# Patient Record
Sex: Male | Born: 1975
Health system: Southern US, Community
[De-identification: ages and names within clinical notes are randomized; demographics above are authoritative.]

## PROBLEM LIST (undated history)

## (undated) DIAGNOSIS — B019 Varicella without complication: Secondary | ICD-10-CM

## (undated) DIAGNOSIS — F32A Depression, unspecified: Secondary | ICD-10-CM

## (undated) DIAGNOSIS — F329 Major depressive disorder, single episode, unspecified: Secondary | ICD-10-CM

## (undated) DIAGNOSIS — G4730 Sleep apnea, unspecified: Secondary | ICD-10-CM

## (undated) DIAGNOSIS — E669 Obesity, unspecified: Secondary | ICD-10-CM

## (undated) DIAGNOSIS — M722 Plantar fascial fibromatosis: Secondary | ICD-10-CM

## (undated) DIAGNOSIS — F419 Anxiety disorder, unspecified: Secondary | ICD-10-CM

## (undated) HISTORY — DX: Varicella without complication: B01.9

## (undated) HISTORY — DX: Obesity, unspecified: E66.9

## (undated) HISTORY — DX: Sleep apnea, unspecified: G47.30

## (undated) HISTORY — DX: Depression, unspecified: F32.A

## (undated) HISTORY — DX: Major depressive disorder, single episode, unspecified: F32.9

## (undated) HISTORY — PX: TONSILLECTOMY: SUR1361

## (undated) HISTORY — DX: Plantar fascial fibromatosis: M72.2

## (undated) HISTORY — DX: Anxiety disorder, unspecified: F41.9

---

## 2001-01-15 ENCOUNTER — Encounter: Admission: RE | Admit: 2001-01-15 | Discharge: 2001-01-15 | Payer: Self-pay

## 2003-05-11 ENCOUNTER — Encounter: Admission: RE | Admit: 2003-05-11 | Discharge: 2003-05-11 | Payer: Self-pay | Admitting: Occupational Medicine

## 2004-08-02 ENCOUNTER — Ambulatory Visit: Payer: Self-pay | Admitting: Family Medicine

## 2004-08-09 ENCOUNTER — Ambulatory Visit: Payer: Self-pay | Admitting: Family Medicine

## 2004-09-18 ENCOUNTER — Encounter: Admission: RE | Admit: 2004-09-18 | Discharge: 2004-12-17 | Payer: Self-pay | Admitting: Family Medicine

## 2005-01-13 ENCOUNTER — Ambulatory Visit: Payer: Self-pay | Admitting: Family Medicine

## 2005-01-27 ENCOUNTER — Ambulatory Visit: Payer: Self-pay | Admitting: Family Medicine

## 2005-09-09 ENCOUNTER — Ambulatory Visit: Payer: Self-pay | Admitting: Family Medicine

## 2005-11-09 ENCOUNTER — Emergency Department (HOSPITAL_COMMUNITY): Admission: EM | Admit: 2005-11-09 | Discharge: 2005-11-09 | Payer: Self-pay | Admitting: Family Medicine

## 2005-11-10 ENCOUNTER — Ambulatory Visit: Payer: Self-pay | Admitting: Family Medicine

## 2005-11-17 ENCOUNTER — Ambulatory Visit: Payer: Self-pay | Admitting: Family Medicine

## 2006-02-22 ENCOUNTER — Emergency Department (HOSPITAL_COMMUNITY): Admission: EM | Admit: 2006-02-22 | Discharge: 2006-02-22 | Payer: Self-pay | Admitting: Family Medicine

## 2006-02-23 ENCOUNTER — Ambulatory Visit: Payer: Self-pay | Admitting: Family Medicine

## 2006-02-27 ENCOUNTER — Ambulatory Visit: Payer: Self-pay | Admitting: Family Medicine

## 2007-04-27 ENCOUNTER — Telehealth: Payer: Self-pay | Admitting: Family Medicine

## 2007-10-22 ENCOUNTER — Ambulatory Visit: Payer: Self-pay | Admitting: Family Medicine

## 2007-10-26 ENCOUNTER — Encounter: Payer: Self-pay | Admitting: Family Medicine

## 2007-10-26 LAB — CONVERTED CEMR LAB
ALT: 26 units/L (ref 0–53)
AST: 35 units/L (ref 0–37)
Albumin: 4.1 g/dL (ref 3.5–5.2)
Alkaline Phosphatase: 64 units/L (ref 39–117)
BUN: 11 mg/dL (ref 6–23)
Basophils Absolute: 0 10*3/uL (ref 0.0–0.1)
Basophils Relative: 0 % (ref 0.0–1.0)
Bilirubin, Direct: 0.1 mg/dL (ref 0.0–0.3)
CO2: 28 meq/L (ref 19–32)
Calcium: 9.5 mg/dL (ref 8.4–10.5)
Chloride: 104 meq/L (ref 96–112)
Creatinine, Ser: 0.9 mg/dL (ref 0.4–1.5)
Eosinophils Absolute: 0.2 10*3/uL (ref 0.0–0.7)
Eosinophils Relative: 2 % (ref 0.0–5.0)
GFR calc Af Amer: 125 mL/min
GFR calc non Af Amer: 103 mL/min
Glucose, Bld: 86 mg/dL (ref 70–99)
HCT: 45.5 % (ref 39.0–52.0)
Hemoglobin: 15.4 g/dL (ref 13.0–17.0)
Lymphocytes Relative: 38.7 % (ref 12.0–46.0)
MCHC: 33.8 g/dL (ref 30.0–36.0)
MCV: 95 fL (ref 78.0–100.0)
Monocytes Absolute: 0.7 10*3/uL (ref 0.1–1.0)
Monocytes Relative: 7.2 % (ref 3.0–12.0)
Neutro Abs: 4.7 10*3/uL (ref 1.4–7.7)
Neutrophils Relative %: 52.1 % (ref 43.0–77.0)
Platelets: 340 10*3/uL (ref 150–400)
Potassium: 4 meq/L (ref 3.5–5.1)
RBC: 4.79 M/uL (ref 4.22–5.81)
RDW: 11.3 % — ABNORMAL LOW (ref 11.5–14.6)
Sodium: 139 meq/L (ref 135–145)
TSH: 1.2 microintl units/mL (ref 0.35–5.50)
Total Bilirubin: 1.1 mg/dL (ref 0.3–1.2)
Total Protein: 7.4 g/dL (ref 6.0–8.3)
WBC: 9.1 10*3/uL (ref 4.5–10.5)

## 2007-10-29 ENCOUNTER — Emergency Department (HOSPITAL_COMMUNITY): Admission: EM | Admit: 2007-10-29 | Discharge: 2007-10-29 | Payer: Self-pay | Admitting: Emergency Medicine

## 2007-10-29 ENCOUNTER — Ambulatory Visit: Payer: Self-pay | Admitting: Family Medicine

## 2007-10-29 DIAGNOSIS — K219 Gastro-esophageal reflux disease without esophagitis: Secondary | ICD-10-CM

## 2007-11-05 ENCOUNTER — Ambulatory Visit: Payer: Self-pay | Admitting: Family Medicine

## 2007-11-30 ENCOUNTER — Telehealth: Payer: Self-pay | Admitting: Family Medicine

## 2007-12-20 ENCOUNTER — Ambulatory Visit: Payer: Self-pay | Admitting: Family Medicine

## 2007-12-20 DIAGNOSIS — E785 Hyperlipidemia, unspecified: Secondary | ICD-10-CM | POA: Insufficient documentation

## 2007-12-23 ENCOUNTER — Ambulatory Visit: Payer: Self-pay | Admitting: Family Medicine

## 2007-12-23 LAB — CONVERTED CEMR LAB
Bilirubin Urine: NEGATIVE
Blood in Urine, dipstick: NEGATIVE
Cholesterol: 187 mg/dL (ref 0–200)
Glucose, Urine, Semiquant: NEGATIVE
HDL: 26.6 mg/dL — ABNORMAL LOW (ref >39.0)
Ketones, urine, test strip: NEGATIVE
LDL Cholesterol: 130 mg/dL — ABNORMAL HIGH (ref 0–99)
Nitrite: NEGATIVE
Protein, U semiquant: NEGATIVE
Specific Gravity, Urine: 1.02
Total CHOL/HDL Ratio: 7
Triglycerides: 151 mg/dL — ABNORMAL HIGH (ref 0–149)
Urobilinogen, UA: 0.2
VLDL: 30 mg/dL (ref 0–40)
WBC Urine, dipstick: NEGATIVE
pH: 5.5

## 2008-01-05 ENCOUNTER — Telehealth: Payer: Self-pay | Admitting: Family Medicine

## 2008-02-25 ENCOUNTER — Ambulatory Visit: Payer: Self-pay | Admitting: Family Medicine

## 2008-06-21 ENCOUNTER — Telehealth: Payer: Self-pay | Admitting: Family Medicine

## 2008-07-14 ENCOUNTER — Telehealth: Payer: Self-pay | Admitting: Family Medicine

## 2008-08-22 ENCOUNTER — Encounter: Payer: Self-pay | Admitting: Family Medicine

## 2008-09-20 ENCOUNTER — Emergency Department (HOSPITAL_COMMUNITY): Admission: EM | Admit: 2008-09-20 | Discharge: 2008-09-20 | Payer: Self-pay | Admitting: Emergency Medicine

## 2009-01-25 ENCOUNTER — Telehealth: Payer: Self-pay | Admitting: Family Medicine

## 2009-02-23 ENCOUNTER — Emergency Department (HOSPITAL_COMMUNITY): Admission: EM | Admit: 2009-02-23 | Discharge: 2009-02-23 | Payer: Self-pay | Admitting: Family Medicine

## 2009-06-03 ENCOUNTER — Emergency Department (HOSPITAL_COMMUNITY): Admission: EM | Admit: 2009-06-03 | Discharge: 2009-06-03 | Payer: Self-pay | Admitting: Emergency Medicine

## 2009-08-09 ENCOUNTER — Telehealth: Payer: Self-pay | Admitting: Family Medicine

## 2009-08-10 ENCOUNTER — Ambulatory Visit: Payer: Self-pay | Admitting: Family Medicine

## 2010-02-05 ENCOUNTER — Telehealth: Payer: Self-pay | Admitting: Family Medicine

## 2010-02-11 ENCOUNTER — Telehealth: Payer: Self-pay | Admitting: Family Medicine

## 2010-08-06 NOTE — Progress Notes (Signed)
Summary: refill ambien  Phone Note Refill Request Message from:  Fax from Pharmacy on February 11, 2010 9:22 AM  Refills Requested: Medication #1:  AMBIEN 10 MG TABS 1 by mouth at bedtime   Dosage confirmed as above?Dosage Confirmed   Supply Requested: 1 month   Last Refilled: 01/04/2010  Method Requested: Fax to Local Pharmacy Initial call taken by: Raechel Ache, RN,  February 11, 2010 9:22 AM [facility]  Follow-up for Phone Call        call in #30 with 5 rf Follow-up by: Nelwyn Salisbury MD,  February 11, 2010 12:47 PM  Additional Follow-up for Phone Call Additional follow up Details #1::        Rx faxed to pharmacy Additional Follow-up by: Raechel Ache, RN,  February 11, 2010 12:51 PM    Prescriptions: AMBIEN 10 MG TABS (ZOLPIDEM TARTRATE) 1 by mouth at bedtime  #30 x 5   Entered by:   Raechel Ache, RN   Authorized by:   Nelwyn Salisbury MD   Signed by:   Raechel Ache, RN on 02/11/2010   Method used:   Historical   RxID:   [id number]

## 2010-08-06 NOTE — Progress Notes (Signed)
Summary: refill alprazolam  Phone Note Refill Request Message from:  Fax from Pharmacy on February 05, 2010 4:29 PM  Refills Requested: Medication #1:  ALPRAZOLAM 1 MG  TB24 two times a day as needed anxiety   [facility]: [facility]2010 [facility]    [phone]   Method Requested: Telephone to Pharmacy Initial call taken by: Duard Brady LPN,  February 05, 2010 4:29 PM  Follow-up for Phone Call        call in #60 with 5 rf Follow-up by: Nelwyn Salisbury MD,  February 06, 2010 9:44 AM  Additional Follow-up for Phone Call Additional follow up Details #1::        Rx called to pharmacy Additional Follow-up by: Raechel Ache, RN,  February 06, 2010 9:48 AM    Prescriptions: ALPRAZOLAM 1 MG  TB24 (ALPRAZOLAM) two times a day as needed anxiety  #60 x 5   Entered by:   Raechel Ache, RN   Authorized by:   Nelwyn Salisbury MD   Signed by:   Raechel Ache, RN on 02/06/2010   Method used:   Historical   RxID:   [id number]

## 2010-08-06 NOTE — Assessment & Plan Note (Signed)
Summary: MED CK (REFILLS) // RS   Vital Signs:  Patient profile:   35 year old male Weight:      324 pounds BMI:     44.72 Temp:     98.1 degrees F oral Pulse rate:   72 / minute BP sitting:   112 / 82  (left arm) Cuff size:   large  Vitals Entered By: Alfred Levins, CMA (August 10, 2009 11:59 AM) CC: renew meds   History of Present Illness: Here for med refills after a long absence. He had lost his insurance, and now he has it again. he has been pout of meds for a few months, and he has been dealing with a lot of anxiety again. He has trouble sleeping. He continues to put on weight, and he asks to meet with a Nutritionist again.   Current Medications (verified): 1)  Ambien 10 Mg Tabs (Zolpidem Tartrate) .Marland Kitchen.. 1 By Mouth At Bedtime 2)  Prozac 40 Mg  Caps (Fluoxetine Hcl) .Marland Kitchen.. 1 By Mouth Once Daily 3)  Alprazolam 1 Mg  Tb24 (Alprazolam) .... Two Times A Day As Needed Anxiety 4)  Nexium 40 Mg  Cpdr (Esomeprazole Magnesium) .... Once Daily 5)  Diclofenac Sodium 50 Mg Tbec (Diclofenac Sodium) .... Three Times A Day As Needed Pain  Allergies (verified): No Known Drug Allergies  Past History:  Past Medical History: Reviewed history from 12/20/2007 and no changes required. Anxiety insomnia GERD Hyperlipidemia  Review of Systems  The patient denies anorexia, fever, weight loss, vision loss, decreased hearing, hoarseness, chest pain, syncope, dyspnea on exertion, peripheral edema, prolonged cough, headaches, hemoptysis, abdominal pain, melena, hematochezia, hematuria, incontinence, genital sores, muscle weakness, suspicious skin lesions, transient blindness, difficulty walking, depression, unusual weight change, abnormal bleeding, enlarged lymph nodes, angioedema, breast masses, and testicular masses.    Physical Exam  General:  overweight-appearing.   Lungs:  Normal respiratory effort, chest expands symmetrically. Lungs are clear to auscultation, no crackles or wheezes. Heart:   Normal rate and regular rhythm. S1 and S2 normal without gallop, murmur, click, rub or other extra sounds.   Impression & Recommendations:  Problem # 1:  HYPERLIPIDEMIA (ICD-272.4)  Orders: Nutrition Referral (Nutrition)  Problem # 2:  GERD (ICD-530.81)  The following medications were removed from the medication list:    Nexium 40 Mg Cpdr (Esomeprazole magnesium) ..... Once daily His updated medication list for this problem includes:    Omeprazole 40 Mg Cpdr (Omeprazole) ..... Once daily  Problem # 3:  ANXIETY (ICD-300.00)  His updated medication list for this problem includes:    Prozac 40 Mg Caps (Fluoxetine hcl) .Marland Kitchen... 1 by mouth once daily    Alprazolam 1 Mg Tb24 (Alprazolam) .Marland Kitchen..Marland Kitchen Two times a day as needed anxiety  Problem # 4:  SHOULDER PAIN (ICD-719.41)  His updated medication list for this problem includes:    Diclofenac Sodium 50 Mg Tbec (Diclofenac sodium) .Marland Kitchen... Three times a day as needed pain  Complete Medication List: 1)  Ambien 10 Mg Tabs (Zolpidem tartrate) .Marland Kitchen.. 1 by mouth at bedtime 2)  Prozac 40 Mg Caps (Fluoxetine hcl) .Marland Kitchen.. 1 by mouth once daily 3)  Alprazolam 1 Mg Tb24 (Alprazolam) .... Two times a day as needed anxiety 4)  Diclofenac Sodium 50 Mg Tbec (Diclofenac sodium) .... Three times a day as needed pain 5)  Omeprazole 40 Mg Cpdr (Omeprazole) .... Once daily  Patient Instructions: 1)  It is important that you exercise reguarly at least 20 minutes 5 times a  week. If you develop chest pain, have severe difficulty breathing, or feel very tired, stop exercising immediately and seek medical attention.  2)  You need to lose weight. Consider a lower calorie diet and regular exercise.  3)  All meds were refilled. 4)  Will refer to Nutrition 5)  he will set up a cpx soon Prescriptions: DICLOFENAC SODIUM 50 MG TBEC (DICLOFENAC SODIUM) three times a day as needed pain  #90 x 5   Entered and Authorized by:   Nelwyn Salisbury MD   Signed by:   Nelwyn Salisbury MD on  08/10/2009   Method used:   Print then Give to Patient   RxID:   [id number] OMEPRAZOLE 40 MG CPDR (OMEPRAZOLE) once daily  #30 x 11   Entered and Authorized by:   Nelwyn Salisbury MD   Signed by:   Nelwyn Salisbury MD on 08/10/2009   Method used:   Print then Give to Patient   RxID:   [id number] ALPRAZOLAM 1 MG  TB24 (ALPRAZOLAM) two times a day as needed anxiety  #60 x 5   Entered and Authorized by:   Nelwyn Salisbury MD   Signed by:   Nelwyn Salisbury MD on 08/10/2009   Method used:   Print then Give to Patient   RxID:   [id number] PROZAC 40 MG  CAPS (FLUOXETINE HCL) 1 by mouth once daily  #30 x 11   Entered and Authorized by:   Nelwyn Salisbury MD   Signed by:   Nelwyn Salisbury MD on 08/10/2009   Method used:   Print then Give to Patient   RxID:   [id number] AMBIEN 10 MG TABS (ZOLPIDEM TARTRATE) 1 by mouth at bedtime  #30 x 5   Entered and Authorized by:   Nelwyn Salisbury MD   Signed by:   Nelwyn Salisbury MD on 08/10/2009   Method used:   Print then Give to Patient   RxID:   [id number]

## 2010-08-06 NOTE — Progress Notes (Signed)
Summary: refill xanax and ambien  Phone Note From Pharmacy   [facility]  [address] [phone]* Call For: Docie Abramovich  Summary of Call: refill xanax 1mg  1 by mouth two times a day as needed for anxiety and zolpidem 10mg  1 by mouth at bedtime Initial call taken by: Alfred Levins, CMA,  August 09, 2009 9:54 AM  Follow-up for Phone Call        We have not seen him in almost 2 years. he needs an OV to do this Follow-up by: Nelwyn Salisbury MD,  August 09, 2009 10:49 AM  Additional Follow-up for Phone Call Additional follow up Details #1::        Phone call completed, Pharmacist called Additional Follow-up by: Alfred Levins, CMA,  August 09, 2009 10:59 AM

## 2010-08-07 ENCOUNTER — Other Ambulatory Visit: Payer: Self-pay

## 2010-08-07 DIAGNOSIS — F419 Anxiety disorder, unspecified: Secondary | ICD-10-CM

## 2010-08-07 DIAGNOSIS — G4700 Insomnia, unspecified: Secondary | ICD-10-CM

## 2010-08-07 MED ORDER — ALPRAZOLAM 1 MG PO TABS: 1.0000 mg | ORAL_TABLET | Freq: Two times a day (BID) | ORAL | Status: DC | PRN

## 2010-08-07 MED ORDER — ZOLPIDEM TARTRATE 10 MG PO TABS: 10.0000 mg | ORAL_TABLET | Freq: Every evening | ORAL | Status: DC | PRN

## 2010-08-07 NOTE — Telephone Encounter (Signed)
[facility]ed [facility]  Fax [phone]

## 2010-08-08 ENCOUNTER — Other Ambulatory Visit: Payer: Self-pay

## 2010-08-08 DIAGNOSIS — G4700 Insomnia, unspecified: Secondary | ICD-10-CM

## 2010-08-08 DIAGNOSIS — F419 Anxiety disorder, unspecified: Secondary | ICD-10-CM

## 2010-08-09 MED ORDER — ALPRAZOLAM 1 MG PO TABS: 1.0000 mg | ORAL_TABLET | Freq: Two times a day (BID) | ORAL | Status: DC | PRN

## 2010-08-09 MED ORDER — ZOLPIDEM TARTRATE 10 MG PO TABS: 10.0000 mg | ORAL_TABLET | Freq: Every evening | ORAL | Status: AC | PRN

## 2010-10-03 ENCOUNTER — Other Ambulatory Visit: Payer: Self-pay | Admitting: Family Medicine

## 2010-10-05 ENCOUNTER — Other Ambulatory Visit: Payer: Self-pay | Admitting: Family Medicine

## 2010-10-09 LAB — RAPID STREP SCREEN (MED CTR MEBANE ONLY): Streptococcus, Group A Screen (Direct): NEGATIVE

## 2010-10-12 LAB — POCT URINALYSIS DIP (DEVICE)
Glucose, UA: NEGATIVE mg/dL
Nitrite: NEGATIVE
Protein, ur: 100 mg/dL — AB
Specific Gravity, Urine: >=1.03 (ref 1.005–1.030)
Urobilinogen, UA: 1 mg/dL (ref 0.0–1.0)
pH: 6 (ref 5.0–8.0)

## 2010-11-04 ENCOUNTER — Other Ambulatory Visit: Payer: Self-pay | Admitting: Family Medicine

## 2010-12-25 ENCOUNTER — Telehealth: Payer: Self-pay

## 2010-12-25 DIAGNOSIS — F419 Anxiety disorder, unspecified: Secondary | ICD-10-CM

## 2010-12-25 NOTE — Telephone Encounter (Signed)
Alprazolam 1 mg  Last rx written 08/08/10 with 5 refills

## 2010-12-26 NOTE — Telephone Encounter (Signed)
Call in #60 with 5 rf 

## 2010-12-27 MED ORDER — ALPRAZOLAM 1 MG PO TABS: 1.0000 mg | ORAL_TABLET | Freq: Two times a day (BID) | ORAL | Status: AC | PRN

## 2010-12-27 NOTE — Telephone Encounter (Signed)
Done

## 2011-07-16 ENCOUNTER — Telehealth: Payer: Self-pay | Admitting: Family Medicine

## 2011-07-16 NOTE — Telephone Encounter (Signed)
Refill request for Xanax 1 mg take 1 po bid and Ambien 10 mg take 1 po qhs and not sure when pt was last here.

## 2011-07-17 NOTE — Telephone Encounter (Signed)
NO refills until he sees me. It has been 2 years

## 2011-07-21 NOTE — Telephone Encounter (Signed)
I tried to call pt, no working home phone # and was told by person answering the cell # that I had the wrong number.

## 2012-09-29 ENCOUNTER — Other Ambulatory Visit: Payer: Self-pay | Admitting: Occupational Medicine

## 2012-09-29 ENCOUNTER — Ambulatory Visit: Payer: Self-pay

## 2012-09-29 DIAGNOSIS — Z021 Encounter for pre-employment examination: Secondary | ICD-10-CM

## 2012-12-21 ENCOUNTER — Other Ambulatory Visit (HOSPITAL_COMMUNITY)
Admission: RE | Admit: 2012-12-21 | Discharge: 2012-12-21 | Disposition: A | Discharge status: Home / Self Care | Payer: BC Managed Care – PPO | Source: Ambulatory Visit | Attending: Family Medicine | Admitting: Family Medicine

## 2012-12-21 ENCOUNTER — Ambulatory Visit (INDEPENDENT_AMBULATORY_CARE_PROVIDER_SITE_OTHER): Payer: BC Managed Care – PPO | Admitting: Family Medicine

## 2012-12-21 ENCOUNTER — Encounter: Payer: Self-pay | Admitting: Family Medicine

## 2012-12-21 VITALS — BP 98/70 | Temp 98.1°F | Ht 71.0 in | Wt 296.0 lb

## 2012-12-21 DIAGNOSIS — Z7689 Persons encountering health services in other specified circumstances: Secondary | ICD-10-CM

## 2012-12-21 DIAGNOSIS — Z23 Encounter for immunization: Secondary | ICD-10-CM

## 2012-12-21 DIAGNOSIS — Z113 Encounter for screening for infections with a predominantly sexual mode of transmission: Secondary | ICD-10-CM | POA: Insufficient documentation

## 2012-12-21 DIAGNOSIS — Z2089 Contact with and (suspected) exposure to other communicable diseases: Secondary | ICD-10-CM

## 2012-12-21 DIAGNOSIS — Z202 Contact with and (suspected) exposure to infections with a predominantly sexual mode of transmission: Secondary | ICD-10-CM

## 2012-12-21 DIAGNOSIS — K219 Gastro-esophageal reflux disease without esophagitis: Secondary | ICD-10-CM

## 2012-12-21 DIAGNOSIS — Z7189 Other specified counseling: Secondary | ICD-10-CM

## 2012-12-21 DIAGNOSIS — E669 Obesity, unspecified: Secondary | ICD-10-CM

## 2012-12-21 LAB — LIPID PANEL
Cholesterol: 168 mg/dL (ref 0–200)
HDL: 35.3 mg/dL — ABNORMAL LOW (ref >39.00)
Total CHOL/HDL Ratio: 5
Triglycerides: 202 mg/dL — ABNORMAL HIGH (ref 0.0–149.0)
VLDL: 40.4 mg/dL — ABNORMAL HIGH (ref 0.0–40.0)

## 2012-12-21 LAB — LDL CHOLESTEROL, DIRECT: Direct LDL: 108.6 mg/dL

## 2012-12-21 LAB — HEMOGLOBIN A1C: Hgb A1c MFr Bld: 5.5 % (ref 4.6–6.5)

## 2012-12-21 MED ORDER — OMEPRAZOLE 20 MG PO CPDR: 20.0000 mg | DELAYED_RELEASE_CAPSULE | Freq: Every day | ORAL | Status: DC

## 2012-12-21 MED ORDER — METRONIDAZOLE 500 MG PO TABS: 2000.0000 mg | ORAL_TABLET | Freq: Once | ORAL | Status: DC

## 2012-12-21 NOTE — Addendum Note (Signed)
Addended by: Azucena Freed on: 12/21/2012 11:07 AM   Modules accepted: Orders

## 2012-12-21 NOTE — Progress Notes (Signed)
Chief Complaint  Patient presents with  . Establish Care    HPI:  Paul Sweeney. is here to establish care. Used to see Dr. Clent Ridges - but has been several years since seen. Last PCP and physical: none in recent years.  Has the following chronic problems and concerns today:  Concern for Trichomonas: -separated from wife, but they occ still have relationships -she has trich and he wants to be tested for this -he is asymptomatic  GERD: -heartburn about a few times per week -worse at night -take prilosec intermittently for this    Patient Active Problem List   Diagnosis Date Noted  . HYPERLIPIDEMIA 12/20/2007  . GERD 10/29/2007   Health Maintenance:  ROS: See pertinent positives and negatives per HPI.  Past Medical History  Diagnosis Date  . Chicken pox   . Depression     never hospitalized, remote    Family History  Problem Relation Age of Onset  . Colon cancer    . Prostate cancer    . Hyperlipidemia Mother   . Hypertension Mother   . Diabetes Mother     History   Social History  . Marital Status: Legally Separated    Spouse Name: N/A    Number of Children: N/A  . Years of Education: N/A   Social History Main Topics  . Smoking status: Never Smoker   . Smokeless tobacco: None  . Alcohol Use: Yes     Comment: occ, 4 drinks about 1-2 time sper month  . Drug Use: No  . Sexually Active: None   Other Topics Concern  . 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, 8 and 6 in 2014)      Spiritual Beliefs:      Lifestyle: exercises daily - gym 5 days, CV, swimming, weights; diet is improving - son has glut-1 and on adkins type diet, pt has lost 30 lbs in last few months             Current outpatient prescriptions:Multiple Vitamins-Minerals (MULTIVITAL) tablet, Take 1 tablet by mouth daily., Disp: , Rfl: ;  metroNIDAZOLE (FLAGYL) 500 MG tablet, Take 4 tablets  (2,000 mg total) by mouth once., Disp: 4 tablet, Rfl: 0;  omeprazole (PRILOSEC) 20 MG capsule, Take 1 capsule (20 mg total) by mouth daily., Disp: 30 capsule, Rfl: 3  EXAM:  Filed Vitals:   12/21/12 0943  BP: 98/70  Temp: 98.1 F (36.7 C)    Body mass index is 41.3 kg/(m^2).  GENERAL: vitals reviewed and listed above, alert, oriented, appears well hydrated and in no acute distress  HEENT: atraumatic, conjunttiva clear, no obvious abnormalities on inspection of external nose and ears  NECK: no obvious masses on inspection  LUNGS: clear to auscultation bilaterally, no wheezes, rales or rhonchi, good air movement  CV: HRRR, no peripheral edema  MS: moves all extremities without noticeable abnormality  PSYCH: pleasant and cooperative, no obvious depression or anxiety  ASSESSMENT AND PLAN:  Discussed the following assessment and plan:  Exposure to venereal disease - Plan: Urine cytology ancillary only, HIV Antibody, RPR, Hep B Surface Antigen, Hepatitis B Core AB, Total, metroNIDAZOLE (FLAGYL) 500 MG tablet -return precautions/risks discussed  Encounter to establish care - Plan: Lipid Panel, Hemoglobin A1c - has not eaten in 7 hours - wants referral to nutrition for help with weight loss after labs  GERD (  gastroesophageal reflux disease) - Plan: omeprazole (PRILOSEC) 20 MG capsule -return precautions risks discussed  Obesity -lifestyle recs, labs, referral to nutrition after labs per his request  -We reviewed the PMH, PSH, FH, SH, Meds and Allergies. -We provided refills for any medications we will prescribe as needed. -We addressed current concerns per orders and patient instructions. -STI testing and tx for possible asymptomatic trich per his request today - risks discussed -refilled PPI -tdap today -We have advised patient to follow up per instructions below.   -Patient advised to return or notify a doctor immediately if symptoms worsen or persist or new concerns  arise.  Patient Instructions  -We have ordered labs or studies at this visit. It can take up to 1-2 weeks for results and processing. We will contact you with instructions IF your results are abnormal. Normal results will be released to your [facility]. If you have not heard from [place] or can not find your results in [facility] in 2 weeks please contact our office.  -PLEASE SIGN UP FOR MYCHART TODAY   We recommend the following healthy lifestyle measures: - eat a healthy diet consisting of lots of vegetables, fruits, beans, nuts, seeds, healthy meats such as white chicken and fish and whole grains.  - avoid fried foods, fast food, processed foods, sodas, red meet and other fattening foods.  - get a least 150 minutes of aerobic exercise per week.   Take medication as instructed  Follow up in: 1 year or as needed      Leticia Mcdiarmid R.

## 2012-12-21 NOTE — Patient Instructions (Signed)
-  We have ordered labs or studies at this visit. It can take up to 1-2 weeks for results and processing. We will contact you with instructions IF your results are abnormal. Normal results will be released to your [facility]. If you have not heard from [place] or can not find your results in [facility] in 2 weeks please contact our office.  -PLEASE SIGN UP FOR MYCHART TODAY   We recommend the following healthy lifestyle measures: - eat a healthy diet consisting of lots of vegetables, fruits, beans, nuts, seeds, healthy meats such as white chicken and fish and whole grains.  - avoid fried foods, fast food, processed foods, sodas, red meet and other fattening foods.  - get a least 150 minutes of aerobic exercise per week.   Take medication as instructed  Follow up in: 1 year or as needed

## 2012-12-22 ENCOUNTER — Encounter: Payer: Self-pay | Admitting: Family Medicine

## 2012-12-22 LAB — HEPATITIS B SURFACE ANTIGEN: Hepatitis B Surface Ag: NEGATIVE

## 2012-12-22 LAB — HIV ANTIBODY (ROUTINE TESTING W REFLEX): HIV: NONREACTIVE

## 2012-12-22 LAB — HEPATITIS B CORE ANTIBODY, TOTAL: Hep B Core Total Ab: NEGATIVE

## 2012-12-22 LAB — RPR

## 2012-12-23 ENCOUNTER — Telehealth: Payer: Self-pay | Admitting: Family Medicine

## 2012-12-23 NOTE — Telephone Encounter (Signed)
Pls advise.  

## 2012-12-23 NOTE — Telephone Encounter (Signed)
Alisha,  Please have him schedule appt - procedure appt. Let him know depending on location and appearance of these lesions we may be able to remove that day or may need to refer to dermatology.

## 2012-12-23 NOTE — Progress Notes (Signed)
Quick Note:  Left a message for pt at designated cell phone number. ______

## 2013-01-25 ENCOUNTER — Encounter: Payer: Self-pay | Admitting: Family Medicine

## 2013-01-25 ENCOUNTER — Ambulatory Visit (INDEPENDENT_AMBULATORY_CARE_PROVIDER_SITE_OTHER): Payer: BC Managed Care – PPO | Admitting: Family Medicine

## 2013-01-25 VITALS — BP 110/74 | Temp 97.6°F | Wt 286.0 lb

## 2013-01-25 DIAGNOSIS — L989 Disorder of the skin and subcutaneous tissue, unspecified: Secondary | ICD-10-CM

## 2013-01-25 DIAGNOSIS — L919 Hypertrophic disorder of the skin, unspecified: Secondary | ICD-10-CM

## 2013-01-25 DIAGNOSIS — E669 Obesity, unspecified: Secondary | ICD-10-CM

## 2013-01-25 DIAGNOSIS — E785 Hyperlipidemia, unspecified: Secondary | ICD-10-CM

## 2013-01-25 DIAGNOSIS — L918 Other hypertrophic disorders of the skin: Secondary | ICD-10-CM

## 2013-01-25 NOTE — Progress Notes (Signed)
Chief Complaint  Patient presents with  . skin tag removal    HPI:  Acute visit for:  1)Skin Tags: -neck and armpits - desires removal  2) skin lesion forehead: -worried about this and desires removal -has been there for some time -doesn't think has changed  3)Obesity/Dyslipidema: -working on diet and exercise -was not contacted about nutrition referral and still wishes to do this  ROS: See pertinent positives and negatives per HPI.  Past Medical History  Diagnosis Date  . Chicken pox   . Depression     never hospitalized, remote    Family History  Problem Relation Age of Onset  . Colon cancer    . Prostate cancer    . Hyperlipidemia Mother   . Hypertension Mother   . Diabetes Mother     History   Social History  . Marital Status: Legally Separated    Spouse Name: N/A    Number of Children: N/A  . Years of Education: N/A   Social History Main Topics  . Smoking status: Never Smoker   . Smokeless tobacco: None  . Alcohol Use: Yes     Comment: occ, 4 drinks about 1-2 time sper month  . Drug Use: No  . Sexually Active: None   Other Topics Concern  . 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  GENERAL: vitals reviewed and listed above, alert, oriented, appears well hydrated and in no acute  distress  HEENT: atraumatic, conjunttiva clear, no obvious abnormalities on inspection of external nose and ears  NECK: no obvious masses on inspection  SKIN: several larger skin tags neck and axillary region, flesh colored papule on L medial brow with telangiectsias  MS: moves all extremities without noticeable abnormality  PSYCH: pleasant and cooperative, no obvious depression or anxiety  ASSESSMENT AND PLAN:  Discussed the following assessment and plan:  Skin lesion - Plan: Ambulatory referral to Dermatology  Obesity, unspecified - Plan: Ambulatory referral to Nutrition and Diabetic Education  HYPERLIPIDEMIA - Plan: Ambulatory referral to Nutrition and Diabetic Education  Cutaneous skin tags - Plan: Ambulatory referral to Dermatology  -congratulated and supported on lifestyle changes and replaced referral as appears not done -discussed skin findings, potential etiology including neoplasm though less likely and offered removal her versus derm referral. He prefers not to have lesions removed today and desires referral to derm - referral placed.  -Patient advised to return or notify a doctor immediately if symptoms worsen or persist or new concerns arise.  Patient Instructions  -We placed a referral for you as discussed to the nutritionist and to the dermatologist. It usually takes about 1-2 weeks to process and schedule this  referral. If you have not heard from [place] regarding this appointment in 2 weeks please contact our office.  -keep up the great work with exercise and eating healthy       Amarissa Koerner R.

## 2013-01-25 NOTE — Patient Instructions (Signed)
-  We placed a referral for you as discussed to the nutritionist and to the dermatologist. It usually takes about 1-2 weeks to process and schedule this referral. If you have not heard from [place] regarding this appointment in 2 weeks please contact our office.  -keep up the great work with exercise and eating healthy

## 2013-02-07 ENCOUNTER — Telehealth: Payer: Self-pay | Admitting: Family Medicine

## 2013-02-07 NOTE — Telephone Encounter (Signed)
Caller Name: [place]  Phone: [phone]  Patient: Paul Sweeney, Paul Sweeney  Gender: Male  DOB: 1976/04/16  Age: 37 Years  PCP: Kriste Basque [facility])   Does the office need to follow up with this patient?: No  RN Note:  going through divorce, symptoms: nausea, not sleeping, "rush" not able to work, with a headache.   Reason For Call & Symptoms: emergent call, regarding panic attack, anxiety  Reviewed Health History In EMR: Yes  Reviewed Medications In EMR: Yes  Reviewed Allergies In EMR: Yes  Reviewed Surgeries / Procedures: Yes  Date of Onset of Symptoms: 02/05/2013  Guideline(s) Used:  Vomiting  Disposition Per Guideline:  See Today in Office  Reason For Disposition Reached:  Patient wants to be seen  Advice Given:  Call Back If:  You become worse.  Patient Will Follow Care Advice:  YES  Appointment Scheduled:  02/08/2013 10:45:00; Provider:  Kriste Basque [facility])

## 2013-02-08 ENCOUNTER — Ambulatory Visit (INDEPENDENT_AMBULATORY_CARE_PROVIDER_SITE_OTHER): Payer: BC Managed Care – PPO | Admitting: Family Medicine

## 2013-02-08 ENCOUNTER — Encounter: Payer: Self-pay | Admitting: Family Medicine

## 2013-02-08 VITALS — BP 102/70 | Temp 98.0°F | Wt 286.0 lb

## 2013-02-08 DIAGNOSIS — K529 Noninfective gastroenteritis and colitis, unspecified: Secondary | ICD-10-CM

## 2013-02-08 DIAGNOSIS — F341 Dysthymic disorder: Secondary | ICD-10-CM

## 2013-02-08 DIAGNOSIS — K5289 Other specified noninfective gastroenteritis and colitis: Secondary | ICD-10-CM

## 2013-02-08 DIAGNOSIS — F419 Anxiety disorder, unspecified: Secondary | ICD-10-CM

## 2013-02-08 MED ORDER — ONDANSETRON HCL 4 MG PO TABS: 4.0000 mg | ORAL_TABLET | Freq: Three times a day (TID) | ORAL | Status: DC | PRN

## 2013-02-08 MED ORDER — PAROXETINE HCL 20 MG PO TABS: 20.0000 mg | ORAL_TABLET | ORAL | Status: DC

## 2013-02-08 NOTE — Progress Notes (Signed)
Chief Complaint  Patient presents with  . Anxiety    HPI:  Acute visit for Anxiety and Depression: -hx of remote depression -going through divorce process -symptoms: worsening last few days nausea, a little diarrhea, difficulty sleeping, difficulty with work, headaches, feels overwhelmed after returning from vacation and dealing with x. Feels like gets irritable. Feels like has too many things gong on and poor appetite. Has a depressed mood as well. No thoughts of self harm or SI now or in the past. No Panic attacks. -reports on several medications in the past for depression and anxiety -does not see counselor -usually exercises but not recently -drinks a few drinks here and there -denies: fevers, chills, SI, sore throat, upper resp symptoms, manic symptoms  ROS: See pertinent positives and negatives per HPI.  Past Medical History  Diagnosis Date  . Chicken pox   . Depression     never hospitalized, remote    Family History  Problem Relation Age of Onset  . Colon cancer    . Prostate cancer    . Hyperlipidemia Mother   . Hypertension Mother   . Diabetes Mother     History   Social History  . Marital Status: Legally Separated    Spouse Name: N/A    Number of Children: N/A  . Years of Education: N/A   Social History Main Topics  . Smoking status: Never Smoker   . Smokeless tobacco: None  . Alcohol Use: Yes     Comment: occ, 4 drinks about 1-2 time sper month  . Drug Use: No  . Sexually Active: None   Other Topics Concern  . 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, 8 and 6 in 2014)      Spiritual Beliefs:      Lifestyle: exercises daily - gym 5 days, CV, swimming, weights; diet is improving - son has glut-1 and on adkins type diet, pt has lost 30 lbs in last few months             Current outpatient prescriptions:Multiple Vitamins-Minerals (MULTIVITAL) tablet, Take 1  tablet by mouth daily., Disp: , Rfl: ;  omeprazole (PRILOSEC) 20 MG capsule, Take 1 capsule (20 mg total) by mouth daily., Disp: 30 capsule, Rfl: 3;  ondansetron (ZOFRAN) 4 MG tablet, Take 1 tablet (4 mg total) by mouth every 8 (eight) hours as needed for nausea., Disp: 20 tablet, Rfl: 0 PARoxetine (PAXIL) 20 MG tablet, Take 1 tablet (20 mg total) by mouth every morning., Disp: 30 tablet, Rfl: 3  EXAM:  Filed Vitals:   02/08/13 1049  BP: 102/70  Temp: 98 F (36.7 C)    Body mass index is 39.91 kg/(m^2).  GENERAL: vitals reviewed and listed above, alert, oriented, appears well hydrated and in no acute distress  HEENT: atraumatic, conjunttiva clear, no obvious abnormalities on inspection of external nose and ears  NECK: no obvious masses on inspection  LUNGS: clear to auscultation bilaterally, no wheezes, rales or rhonchi, good air movement  CV: HRRR, no peripheral edema  ABD: BS+, Soft, NTTP  MS: moves all extremities without noticeable abnormality  PSYCH: pleasant and cooperative, depressed mood  ASSESSMENT AND PLAN:  Discussed the following assessment and plan:  Anxiety and depression - Plan: PARoxetine (PAXIL) 20 MG tablet -no thoughts of self harm; having both depression and anxiety - more depressive; discussed options; will start paxil  (  risks and use discussed) and advised counseling and regular exercise -return precautions and follow up in 1 month  Gastroenteritis - Plan: ondansetron (ZOFRAN) 4 MG tablet -benign abd exam -oral hydration, zofran imodium, return precuations  -Patient advised to return or notify a doctor immediately if symptoms worsen or persist or new concerns arise.  Patient Instructions  FOR Anxiety and Depression:  -call to start counseling  -start paroxetine 20mg  daily - will need to wean off when you stop this medication  -exercise at least 5 days per week  -see a doctor immediately if worsening  FOR nausea and loose stools: -zofran if  needed according to instructions  -plenty of fluids  -no dairy for 1 week  -imodium for loose bowels or diarrhea     Paul Hain R.

## 2013-02-08 NOTE — Patient Instructions (Signed)
FOR Anxiety and Depression:  -call to start counseling  -start paroxetine 20mg  daily - will need to wean off when you stop this medication  -exercise at least 5 days per week  -see a doctor immediately if worsening  FOR nausea and loose stools: -zofran if needed according to instructions  -plenty of fluids  -no dairy for 1 week  -imodium for loose bowels or diarrhea

## 2013-03-15 ENCOUNTER — Encounter: Payer: Self-pay | Admitting: Family Medicine

## 2013-03-15 DIAGNOSIS — Z0289 Encounter for other administrative examinations: Secondary | ICD-10-CM

## 2013-03-15 NOTE — Progress Notes (Signed)
No show  This encounter was created in error - please disregard.

## 2013-03-25 ENCOUNTER — Ambulatory Visit (INDEPENDENT_AMBULATORY_CARE_PROVIDER_SITE_OTHER): Payer: BC Managed Care – PPO | Admitting: Family Medicine

## 2013-03-25 ENCOUNTER — Encounter: Payer: Self-pay | Admitting: Family Medicine

## 2013-03-25 VITALS — BP 102/74 | HR 72 | Temp 97.9°F | Wt 286.0 lb

## 2013-03-25 DIAGNOSIS — J069 Acute upper respiratory infection, unspecified: Secondary | ICD-10-CM

## 2013-03-25 DIAGNOSIS — Z23 Encounter for immunization: Secondary | ICD-10-CM

## 2013-03-25 MED ORDER — HYDROCODONE-HOMATROPINE 5-1.5 MG/5ML PO SYRP: 5.0000 mL | ORAL_SOLUTION | Freq: Three times a day (TID) | ORAL | Status: DC | PRN

## 2013-03-25 NOTE — Addendum Note (Signed)
Addended by: Azucena Freed on: 03/25/2013 08:23 AM   Modules accepted: Orders

## 2013-03-25 NOTE — Progress Notes (Signed)
Chief Complaint  Patient presents with  . Cough    congestion,sinus pain and pressure     HPI:  Acute visit for:  1)sinus congestion: -started 3-4 days ago -symptoms: nasal congestion, sinus pain and pressure, cough, drainage in throat - seems like worsening -denies: fevers, tooth pain, SOB, wheezing, rash, NVD -has tried: OTC cold medication, sinus and allergy OTC medications  ROS: See pertinent positives and negatives per HPI.  Past Medical History  Diagnosis Date  . Chicken pox   . Depression     never hospitalized, remote    Past Surgical History  Procedure Laterality Date  . Tonsillectomy      age 37     Family History  Problem Relation Age of Onset  . Colon cancer    . Prostate cancer    . Hyperlipidemia Mother   . Hypertension Mother   . Diabetes Mother     History   Social History  . Marital Status: Legally Separated    Spouse Name: N/A    Number of Children: N/A  . Years of Education: N/A   Social History Main Topics  . Smoking status: Never Smoker   . Smokeless tobacco: None  . Alcohol Use: Yes     Comment: occ, 4 drinks about 1-2 time sper month  . Drug Use: No  . Sexual Activity: None   Other Topics Concern  . 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, 8 and 6 in 2014)      Spiritual Beliefs:      Lifestyle: exercises daily - gym 5 days, CV, swimming, weights; diet is improving - son has glut-1 and on adkins type diet, pt has lost 30 lbs in last few months             Current outpatient prescriptions:Multiple Vitamins-Minerals (MULTIVITAL) tablet, Take 1 tablet by mouth daily., Disp: , Rfl: ;  omeprazole (PRILOSEC) 20 MG capsule, Take 1 capsule (20 mg total) by mouth daily., Disp: 30 capsule, Rfl: 3;  ondansetron (ZOFRAN) 4 MG tablet, Take 1 tablet (4 mg total) by mouth every 8 (eight) hours as needed for nausea., Disp: 20 tablet, Rfl: 0 PARoxetine  (PAXIL) 20 MG tablet, Take 1 tablet (20 mg total) by mouth every morning., Disp: 30 tablet, Rfl: 3;  HYDROcodone-homatropine (HYCODAN) 5-1.5 MG/5ML syrup, Take 5 mLs by mouth every 8 (eight) hours as needed for cough., Disp: 120 mL, Rfl: 0  EXAM:  Filed Vitals:   03/25/13 0805  BP: 102/74  Pulse: 72  Temp: 97.9 F (36.6 C)    Body mass index is 39.91 kg/(m^2).  GENERAL: vitals reviewed and listed above, alert, oriented, appears well hydrated and in no acute distress  HEENT: atraumatic, conjunttiva clear, no obvious abnormalities on inspection of external nose and ears, normal appearance of ear canals and TMs, clear nasal congestion, mild post oropharyngeal erythema with PND, no tonsillar edema or exudate, no sinus TTP  NECK: no obvious masses on inspection  LUNGS: clear to auscultation bilaterally, no wheezes, rales or rhonchi, good air movement  CV: HRRR, no peripheral edema  MS: moves all extremities without noticeable abnormality  PSYCH: pleasant and cooperative, no obvious depression or anxiety  ASSESSMENT AND PLAN:  Discussed the following assessment and plan:  Viral upper respiratory illness - Plan: HYDROcodone-homatropine (HYCODAN) 5-1.5 MG/5ML syrup  -VURI likely -Recommendations per orders an instructions, risks  and use of medications and return precautions discussed. -flu shot given -Patient advised to return or notify a doctor immediately if symptoms worsen or persist or new concerns arise.  Patient Instructions  INSTRUCTIONS FOR UPPER RESPIRATORY INFECTION:  -plenty of rest and fluids  -nasal saline wash 2-3 times daily (use prepackaged nasal saline or bottled/distilled water if making your own)   -can use sinex or afrin nasal spray for drainage and nasal congestion - but do NOT use longer then 3-4 days  -can use tylenol or ibuprofen as directed for aches and sorethroat  -if you are taking a cough medication - use only as directed, may also try a teaspoon  of honey to coat the throat and throat lozenges  -for sore throat, salt water gargles can help  -follow up if you have fevers, facial pain, tooth pain, difficulty breathing or are worsening or not getting better in 5-7 days      KIM, HANNAH R.

## 2013-03-25 NOTE — Patient Instructions (Signed)
INSTRUCTIONS FOR UPPER RESPIRATORY INFECTION:  -plenty of rest and fluids  -nasal saline wash 2-3 times daily (use prepackaged nasal saline or bottled/distilled water if making your own)   -can use sinex or afrin nasal spray for drainage and nasal congestion - but do NOT use longer then 3-4 days  -can use tylenol or ibuprofen as directed for aches and sorethroat  -if you are taking a cough medication - use only as directed, may also try a teaspoon of honey to coat the throat and throat lozenges  -for sore throat, salt water gargles can help  -follow up if you have fevers, facial pain, tooth pain, difficulty breathing or are worsening or not getting better in 5-7 days  

## 2013-05-23 ENCOUNTER — Encounter: Payer: Self-pay | Admitting: Family Medicine

## 2013-05-23 ENCOUNTER — Ambulatory Visit (INDEPENDENT_AMBULATORY_CARE_PROVIDER_SITE_OTHER): Payer: BC Managed Care – PPO | Admitting: Family Medicine

## 2013-05-23 VITALS — BP 102/70 | Temp 97.6°F | Wt 298.0 lb

## 2013-05-23 DIAGNOSIS — J069 Acute upper respiratory infection, unspecified: Secondary | ICD-10-CM

## 2013-05-23 MED ORDER — AMOXICILLIN 875 MG PO TABS: 875.0000 mg | ORAL_TABLET | Freq: Two times a day (BID) | ORAL | Status: DC

## 2013-05-23 NOTE — Progress Notes (Signed)
Pre visit review using our clinic review tool, if applicable. No additional management support is needed unless otherwise documented below in the visit note. 

## 2013-05-23 NOTE — Progress Notes (Signed)
Chief Complaint  Patient presents with  . Headache    eye pain, congestion, cough, fatigue x couple days     HPI:  -started:  -symptoms:nasal congestion, sore throat, cough, facial pain, fatigue,drainage -denies:fever, SOB, NVD, tooth pain -has tried: nothing -sick contacts/travel/risks: denies flu exposure, tick exposure or or Ebola risks -Hx of: sinus infection ROS: See pertinent positives and negatives per HPI.  Past Medical History  Diagnosis Date  . Chicken pox   . Depression     never hospitalized, remote    Past Surgical History  Procedure Laterality Date  . Tonsillectomy      age 24     Family History  Problem Relation Age of Onset  . Colon cancer    . Prostate cancer    . Hyperlipidemia Mother   . Hypertension Mother   . Diabetes Mother     History   Social History  . Marital Status: Legally Separated    Spouse Name: N/A    Number of Children: N/A  . Years of Education: N/A   Social History Main Topics  . Smoking status: Never Smoker   . Smokeless tobacco: None  . Alcohol Use: Yes     Comment: occ, 4 drinks about 1-2 time sper month  . Drug Use: No  . Sexual Activity: None   Other Topics Concern  . 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, 8 and 6 in 2014)      Spiritual Beliefs:      Lifestyle: exercises daily - gym 5 days, CV, swimming, weights; diet is improving - son has glut-1 and on adkins type diet, pt has lost 30 lbs in last few months             Current outpatient prescriptions:Multiple Vitamins-Minerals (MULTIVITAL) tablet, Take 1 tablet by mouth daily., Disp: , Rfl: ;  omeprazole (PRILOSEC) 20 MG capsule, Take 1 capsule (20 mg total) by mouth daily., Disp: 30 capsule, Rfl: 3;  PARoxetine (PAXIL) 20 MG tablet, Take 1 tablet (20 mg total) by mouth every morning., Disp: 30 tablet, Rfl: 3 amoxicillin (AMOXIL) 875 MG tablet, Take 1 tablet (875 mg  total) by mouth 2 (two) times daily., Disp: 20 tablet, Rfl: 0  EXAM:  Filed Vitals:   05/23/13 0942  BP: 102/70  Temp: 97.6 F (36.4 C)    Body mass index is 41.58 kg/(m^2).  GENERAL: vitals reviewed and listed above, alert, oriented, appears well hydrated and in no acute distress  HEENT: atraumatic, conjunttiva clear, no obvious abnormalities on inspection of external nose and ears, normal appearance of ear canals and TMs, clear nasal congestion, mild post oropharyngeal erythema with PND, no tonsillar edema or exudate, no sinus TTP  NECK: no obvious masses on inspection  LUNGS: clear to auscultation bilaterally, no wheezes, rales or rhonchi, good air movement  CV: HRRR, no peripheral edema  MS: moves all extremities without noticeable abnormality  PSYCH: pleasant and cooperative, no obvious depression or anxiety  ASSESSMENT AND PLAN:  Discussed the following assessment and plan:  Acute upper respiratory infections of unspecified site - Plan: amoxicillin (AMOXIL) 875 MG tablet  -given HPI and exam findings today, a serious infection or illness is unlikely. We discussed potential etiologies, with VURI being most likely, and advised supportive care and monitoring. We discussed treatment side effects, likely course, antibiotic misuse, transmission, and signs of developing a  serious illness. -abx if not improving over next few days - risks discussed -of course, we advised to return or notify a doctor immediately if symptoms worsen or persist or new concerns arise.    Patient Instructions  INSTRUCTIONS FOR UPPER RESPIRATORY INFECTION:  -plenty of rest and fluids  -nasal saline wash 2-3 times daily (use prepackaged nasal saline or bottled/distilled water if making your own)   -can use sinex or afrin nasal spray for drainage and nasal congestion - but do NOT use longer then 3-4 days  -can use tylenol or ibuprofen as directed for aches and sorethroat  -in the winter time,  using a humidifier at night is helpful (please follow cleaning instructions)  -if you are taking a cough medication - use only as directed, may also try a teaspoon of honey to coat the throat and throat lozenges  -for sore throat, salt water gargles can help  -follow up if you have fevers, facial pain, tooth pain, difficulty breathing or are worsening or not getting better in 5-7 days      Herald Vallin R.

## 2013-05-23 NOTE — Patient Instructions (Signed)

## 2013-07-05 ENCOUNTER — Ambulatory Visit (INDEPENDENT_AMBULATORY_CARE_PROVIDER_SITE_OTHER): Payer: BC Managed Care – PPO | Admitting: Emergency Medicine

## 2013-07-05 VITALS — BP 100/60 | HR 67 | Temp 98.1°F | Resp 16 | Ht 71.0 in | Wt 312.0 lb

## 2013-07-05 DIAGNOSIS — J018 Other acute sinusitis: Secondary | ICD-10-CM

## 2013-07-05 MED ORDER — PSEUDOEPHEDRINE-GUAIFENESIN ER 60-600 MG PO TB12: 1.0000 | ORAL_TABLET | Freq: Two times a day (BID) | ORAL | Status: AC

## 2013-07-05 MED ORDER — AMOXICILLIN-POT CLAVULANATE 875-125 MG PO TABS: 1.0000 | ORAL_TABLET | Freq: Two times a day (BID) | ORAL | Status: DC

## 2013-07-05 NOTE — Progress Notes (Signed)
[facility] [address] [place] [address] [phone]  Date:  07/05/2013   Name:  Paul Sweeney.   DOB:  11-07-1975   MRN:  [id number]  PCP:  Terressa Koyanagi., DO    Chief Complaint: Flu like symptoms   History of Present Illness:  Paul Nee. is a 37 y.o. very pleasant male patient who presents with the following:  Ill for past few days with frontal headache and a sore throat.  Developed purulent nasal drainage and post nasal drip.  Cough that is non productive.  No fever or chills. No wheezing or shortness of breath.  No nausea or vomiting.  No stool change or rash. No improvement with over the counter medications or other home remedies. Denies other complaint or health concern today.   Patient Active Problem List   Diagnosis Date Noted  . HYPERLIPIDEMIA 12/20/2007  . GERD 10/29/2007    Past Medical History  Diagnosis Date  . Chicken pox   . Depression     never hospitalized, remote  . Anxiety     Past Surgical History  Procedure Laterality Date  . Tonsillectomy      age 57     History  Substance Use Topics  . Smoking status: Never Smoker   . Smokeless tobacco: Not on file  . Alcohol Use: Yes     Comment: occ, 4 drinks about 1-2 time sper month    Family History  Problem Relation Age of Onset  . Colon cancer    . Prostate cancer    . Hypertension Mother   . Diabetes Mother   . High Cholesterol Mother   . Healthy Father   . Healthy Sister   . Healthy Brother   . Healthy Brother   . Healthy Brother     No Known Allergies  Medication list has been reviewed and updated.  Current Outpatient Prescriptions on File Prior to Visit  Medication Sig Dispense Refill  . Multiple Vitamins-Minerals (MULTIVITAL) tablet Take 1 tablet by mouth daily.      Marland Kitchen omeprazole (PRILOSEC) 20 MG capsule Take 1 capsule (20 mg total) by mouth daily.  30 capsule  3  . PARoxetine (PAXIL) 20 MG tablet Take 1 tablet (20 mg total) by mouth  every morning.  30 tablet  3   No current facility-administered medications on file prior to visit.    Review of Systems:  As per HPI, otherwise negative.    Physical Examination: Filed Vitals:   07/05/13 1503  BP: 100/60  Pulse: 67  Temp: 98.1 F (36.7 C)  Resp: 16   Filed Vitals:   07/05/13 1503  Height: 5\' 11"  (1.803 m)  Weight: 312 lb (141.522 kg)   Body mass index is 43.53 kg/(m^2). Ideal Body Weight: Weight in (lb) to have BMI = 25: 178.9  GEN: WDWN, NAD, Non-toxic, A & O x 3 HEENT: Atraumatic, Normocephalic. Neck supple. No masses, No LAD. Ears and Nose: No external deformity. CV: RRR, No M/G/R. No JVD. No thrill. No extra heart sounds. PULM: CTA B, no wheezes, crackles, rhonchi. No retractions. No resp. distress. No accessory muscle use. ABD: S, NT, ND, +BS. No rebound. No HSM. EXTR: No c/c/e NEURO Normal gait.  PSYCH: Normally interactive. Conversant. Not depressed or anxious appearing.  Calm demeanor.    Assessment and Plan: Sinusitis augmentin mucinex d  Signed,  Phillips Odor, MD

## 2013-07-05 NOTE — Patient Instructions (Signed)

## 2013-10-05 ENCOUNTER — Encounter: Payer: Self-pay | Admitting: Family Medicine

## 2013-10-05 NOTE — Progress Notes (Signed)
Error   This encounter was created in error - please disregard. 

## 2013-11-26 ENCOUNTER — Other Ambulatory Visit: Payer: Self-pay | Admitting: Family Medicine

## 2014-06-08 ENCOUNTER — Other Ambulatory Visit: Payer: Self-pay | Admitting: Family Medicine

## 2014-06-21 ENCOUNTER — Other Ambulatory Visit: Payer: Self-pay | Admitting: Family Medicine

## 2014-07-19 ENCOUNTER — Other Ambulatory Visit: Payer: Self-pay | Admitting: Family Medicine

## 2014-10-20 ENCOUNTER — Encounter: Payer: Self-pay | Admitting: Family Medicine

## 2014-10-20 ENCOUNTER — Ambulatory Visit (INDEPENDENT_AMBULATORY_CARE_PROVIDER_SITE_OTHER): Payer: BLUE CROSS/BLUE SHIELD | Admitting: Family Medicine

## 2014-10-20 ENCOUNTER — Ambulatory Visit (HOSPITAL_COMMUNITY)
Admission: RE | Admit: 2014-10-20 | Discharge: 2014-10-20 | Disposition: A | Discharge status: Home / Self Care | Payer: BLUE CROSS/BLUE SHIELD | Source: Ambulatory Visit | Attending: Family Medicine | Admitting: Family Medicine

## 2014-10-20 VITALS — BP 136/88 | HR 86 | Temp 97.7°F | Ht 71.0 in | Wt 352.1 lb

## 2014-10-20 DIAGNOSIS — K219 Gastro-esophageal reflux disease without esophagitis: Secondary | ICD-10-CM

## 2014-10-20 DIAGNOSIS — F39 Unspecified mood [affective] disorder: Secondary | ICD-10-CM | POA: Diagnosis not present

## 2014-10-20 DIAGNOSIS — Z6841 Body Mass Index (BMI) 40.0 and over, adult: Secondary | ICD-10-CM | POA: Diagnosis not present

## 2014-10-20 DIAGNOSIS — M79672 Pain in left foot: Secondary | ICD-10-CM | POA: Diagnosis not present

## 2014-10-20 MED ORDER — OMEPRAZOLE 20 MG PO CPDR: 20.0000 mg | DELAYED_RELEASE_CAPSULE | Freq: Every day | ORAL | Status: DC

## 2014-10-20 MED ORDER — FLUOXETINE HCL 20 MG PO TABS: 20.0000 mg | ORAL_TABLET | Freq: Every day | ORAL | Status: DC

## 2014-10-20 NOTE — Progress Notes (Signed)
HPI:  Acute visit for:  L Heel Pain: -reports started a few months ago, worsened a few days ago after went from not exercising to jogging -pain is sharp in L medial plantar heel -worse with first steps in the morning and jogging and rest and heat and ice and ibuprofen -denies: trauma, weakness, numbness  Obesity: -chronic and worsening -diet and exercise: starting to work on diet not exercise -he is not fasting today  GERD: -wants refill on acid reflux medication -stable  GAD: -started last  - on paxil last year briefly, did not follow up thinks didn't help and wants to try something else -generalized anxiety, mildly depressed mood - mild panic occassionally -denies: SI, thoughts of self harm  ROS: See pertinent positives and negatives per HPI.  Past Medical History  Diagnosis Date  . Chicken pox   . Depression     never hospitalized, remote  . Anxiety     Past Surgical History  Procedure Laterality Date  . Tonsillectomy      age 89     Family History  Problem Relation Age of Onset  . Colon cancer    . Prostate cancer    . Hypertension Mother   . Diabetes Mother   . High Cholesterol Mother   . Healthy Father   . Healthy Sister   . Healthy Brother   . Healthy Brother   . Healthy Brother     History   Social History  . Marital Status: Legally Separated    Spouse Name: N/A  . Number of Children: N/A  . Years of Education: N/A   Social History Main Topics  . Smoking status: Never Smoker   . Smokeless tobacco: Not on file  . Alcohol Use: Yes     Comment: occ, 4 drinks about 1-2 time sper month  . Drug Use: No  . Sexual Activity: Not on file   Other Topics Concern  . 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  Multiple Vitamins-Minerals (MULTIVITAL) tablet, Take 1 tablet by mouth daily., Disp: , Rfl:  .  omeprazole (PRILOSEC) 20 MG capsule, Take 1 capsule (20 mg total) by mouth daily., Disp: 90 capsule, Rfl: 3 .  FLUoxetine (PROZAC) 20 MG tablet, Take 1 tablet (20 mg total) by mouth daily., Disp: 30 tablet, Rfl: 1  EXAM:  Filed Vitals:   10/20/14 0850  BP: 136/88  Pulse: 86  Temp: 97.7 F (36.5 C)    Body mass index is 49.13 kg/(m^2).  GENERAL: vitals reviewed and listed above, alert, oriented, appears well hydrated and in no acute distress  HEENT: atraumatic, conjunttiva clear, no obvious abnormalities on inspection of external nose and ears  NECK: no obvious masses on inspection  LUNGS: clear to auscultation bilaterally, no wheezes, rales or rhonchi, good air movement  CV: HRRR, no peripheral edema  MS: moves all extremities without noticeable abnormality -TTP ant and lateral calcaneal regions and in plantar fascia, callus formation minimal of lateral heal, talar tilt  normal, no other bony TTP, neg ant/post drawer, normal pedal pulses, sensation to light touch throughout  PSYCH: pleasant and cooperative, no obvious depression or anxiety  ASSESSMENT AND PLAN:  Discussed the following assessment and plan:  BMI 45.0-49.9, adult -weight loss advised -labs advised, he plans to return for this -lifestyle recs discussed  Heel pain, left -suspect plantar fasciitis, but with wt will check plain films for ? fx if no findings and persistent finding despite HEP and [place] sock would advise sports med eval for [place]  Mood disorder - Plan: FLUoxetine (PROZAC) 20 MG tablet -advised CBT -discuss meds and opted to do trial prozac after discussion risks/benefits -advised follow up in 1 month  Gastroesophageal reflux disease, esophagitis presence not specified -stable, med  refilled  -Patient advised to return or notify a doctor immediately if symptoms worsen or persist or new concerns arise.  Patient Instructions  BEFORE YOU LEAVE: -xray sheet -plantar fasciitis exercises -follow up appointment in 1 month - come fasting  Go get the xray of the L foot  Exercises 4 days per week if xray ok  Set up appointment with Dr. Jason Fila for the anxiety - please call today to schedule  Start the prozac and take once daily  We recommend the following healthy lifestyle measures: - eat a healthy diet consisting of lots of vegetables, fruits, beans, nuts, seeds, healthy meats such as white chicken and fish and whole grains.  - avoid fried foods, fast food, processed foods, sodas, red meet and other fattening foods.  - get a least 150 minutes of aerobic exercise per week.       Kriste Basque R.

## 2014-10-20 NOTE — Progress Notes (Signed)
Pre visit review using our clinic review tool, if applicable. No additional management support is needed unless otherwise documented below in the visit note. 

## 2014-10-20 NOTE — Patient Instructions (Signed)
BEFORE YOU LEAVE: -xray sheet -plantar fasciitis exercises -follow up appointment in 1 month - come fasting  Go get the xray of the L foot  Exercises 4 days per week if xray ok  Set up appointment with Dr. Jason FilaBray for the anxiety - please call today to schedule  Start the prozac and take once daily  We recommend the following healthy lifestyle measures: - eat a healthy diet consisting of lots of vegetables, fruits, beans, nuts, seeds, healthy meats such as white chicken and fish and whole grains.  - avoid fried foods, fast food, processed foods, sodas, red meet and other fattening foods.  - get a least 150 minutes of aerobic exercise per week.

## 2014-11-17 ENCOUNTER — Ambulatory Visit (INDEPENDENT_AMBULATORY_CARE_PROVIDER_SITE_OTHER): Payer: BLUE CROSS/BLUE SHIELD | Admitting: Family Medicine

## 2014-11-17 DIAGNOSIS — R69 Illness, unspecified: Secondary | ICD-10-CM

## 2014-11-17 NOTE — Progress Notes (Signed)
error 

## 2014-11-22 ENCOUNTER — Ambulatory Visit (INDEPENDENT_AMBULATORY_CARE_PROVIDER_SITE_OTHER): Payer: Self-pay | Admitting: Family Medicine

## 2014-11-22 ENCOUNTER — Encounter: Payer: Self-pay | Admitting: Family Medicine

## 2014-11-22 DIAGNOSIS — R69 Illness, unspecified: Secondary | ICD-10-CM

## 2014-11-22 DIAGNOSIS — F39 Unspecified mood [affective] disorder: Secondary | ICD-10-CM | POA: Insufficient documentation

## 2014-11-22 DIAGNOSIS — Z6841 Body Mass Index (BMI) 40.0 and over, adult: Secondary | ICD-10-CM | POA: Insufficient documentation

## 2014-11-22 NOTE — Progress Notes (Signed)
NO SHOW

## 2014-11-29 ENCOUNTER — Encounter: Payer: Self-pay | Admitting: Family Medicine

## 2014-11-29 ENCOUNTER — Ambulatory Visit (INDEPENDENT_AMBULATORY_CARE_PROVIDER_SITE_OTHER): Payer: BLUE CROSS/BLUE SHIELD | Admitting: Family Medicine

## 2014-11-29 VITALS — BP 120/72 | HR 85 | Temp 98.3°F | Ht 71.0 in | Wt 348.9 lb

## 2014-11-29 DIAGNOSIS — M25572 Pain in left ankle and joints of left foot: Secondary | ICD-10-CM

## 2014-11-29 DIAGNOSIS — Z6841 Body Mass Index (BMI) 40.0 and over, adult: Secondary | ICD-10-CM | POA: Diagnosis not present

## 2014-11-29 DIAGNOSIS — F39 Unspecified mood [affective] disorder: Secondary | ICD-10-CM | POA: Diagnosis not present

## 2014-11-29 DIAGNOSIS — J069 Acute upper respiratory infection, unspecified: Secondary | ICD-10-CM

## 2014-11-29 MED ORDER — FLUOXETINE HCL 20 MG PO TABS: 40.0000 mg | ORAL_TABLET | Freq: Every day | ORAL | Status: DC

## 2014-11-29 NOTE — Patient Instructions (Addendum)
BEFORE YOU LEAVE: -schedule follow up in 1 month - COME FASTING but drink plenty of water -brochure with Dr. Janalyn ShyBray's information  Increase the prozac to 40 mg (2 tablets) daily  Schedule an appointment with Dr. Jason FilaBray  INSTRUCTIONS FOR UPPER RESPIRATORY INFECTION:  -plenty of rest and fluids  -nasal saline wash 2-3 times daily (use prepackaged nasal saline or bottled/distilled water if making your own)   -can use AFRIN nasal spray for drainage and nasal congestion - but do NOT use longer then 3-4 days  -can use tylenol (in no history of liver disease) or ibuprofen (if no history of kidney disease, bowel bleeding or significant heart disease) as directed for aches and sorethroat  -in the winter time, using a humidifier at night is helpful (please follow cleaning instructions)  -if you are taking a cough medication - use only as directed, may also try a teaspoon of honey to coat the throat and throat lozenges. If given a cough medication with codeine or hydrocodone or other narcotic please be advised that this contains a strong and  potentially addicting medication. Please follow instructions carefully, take as little as possible and only use AS NEEDED for severe cough. Discuss potential side effects with your pharmacy. Please do not drive or operate machinery while taking these types of medications. Please do not take other sedating medications, drugs or alcohol while taking this medication without discussing with your doctor.  -for sore throat, salt water gargles can help  -follow up if you have fevers, facial pain, tooth pain, difficulty breathing or are worsening or symptoms persist longer then expected  Upper Respiratory Infection, Adult An upper respiratory infection (URI) is also known as the common cold. It is often caused by a type of germ (virus). Colds are easily spread (contagious). You can pass it to others by kissing, coughing, sneezing, or drinking out of the same glass. Usually,  you get better in 1 to 3  weeks.  However, the cough can last for even longer. HOME CARE   Only take medicine as told by your doctor. Follow instructions provided above.  Drink enough water and fluids to keep your pee (urine) clear or pale yellow.  Get plenty of rest.  Return to work when your temperature is < 100 for 24 hours or as told by your doctor. You may use a face mask and wash your hands to stop your cold from spreading. GET HELP RIGHT AWAY IF:   After the first few days, you feel you are getting worse.  You have questions about your medicine.  You have chills, shortness of breath, or red spit (mucus).  You have pain in the face for more then 1-2 days, especially when you bend forward.  You have a fever, puffy (swollen) neck, pain when you swallow, or white spots in the back of your throat.  You have a bad headache, ear pain, sinus pain, or chest pain.  You have a high-pitched whistling sound when you breathe in and out (wheezing).  You cough up blood.  You have sore muscles or a stiff neck. MAKE SURE YOU:   Understand these instructions.  Will watch your condition.  Will get help right away if you are not doing well or get worse. Document Released: 12/10/2007 Document Revised: 09/15/2011 Document Reviewed: 09/28/2013 [facility] Patient Information 2015 [employer], [place]. This information is not intended to replace advice given to you by your health care provider. Make sure you discuss any questions you have with your health  care provider.  

## 2014-11-29 NOTE — Progress Notes (Signed)
Pre visit review using our clinic review tool, if applicable. No additional management support is needed unless otherwise documented below in the visit note. 

## 2014-11-29 NOTE — Progress Notes (Signed)
HPI:  NOTE: no show for last 2 appointments.  L Heel Pain: -reports started a few months ago, worsened after went from not exercising to jogging -initially: pain sharp in L medial plantar heel -worse with first steps in the morning and jogging and rest and heat and ice and ibuprofen -advised  [place] sock; plain films ok -reports doing the exercises and if feeling much -denies: trauma, weakness, numbness  Obesity: -chronic and worsening -diet and exercise: starting to work on diet and exercise -he is not fasting today -advised to return for labs last visit but he did not  GERD: -wants refill on acid reflux medication -stable  GAD: -started last - on paxil last year briefly, did not follow up thinks didn't help; started prozac 10/2014 -initially generalized anxiety, mildly depressed mood - mild panic occasionally -anxiety has improved, but still with some depression on some days -  -denies: SI, thoughts of self harm  URI: -started 1 week ago -symptoms: nasal congestion, PND, cough -denies: SOB, wheezing, fevers, body aches, sinus pain or tooth pain, NVD -tried: dayquil -sick contacts: none known, no tick bites or travel   ROS: See pertinent positives and negatives per HPI.  Past Medical History  Diagnosis Date  . Chicken pox   . Anxiety and depression     never hospitalized, remote  . Obesity   . Plantar fasciitis     calcaneal bone spur    Past Surgical History  Procedure Laterality Date  . Tonsillectomy      age 60     Family History  Problem Relation Age of Onset  . Colon cancer    . Prostate cancer    . Hypertension Mother   . Diabetes Mother   . High Cholesterol Mother   . Healthy Father   . Healthy Sister   . Healthy Brother   . Healthy Brother   . Healthy Brother     History   Social History  . Marital Status: Legally Separated    Spouse Name: N/A  . Number of Children: N/A  . Years of Education: N/A   Social History Main Topics    . Smoking status: Never Smoker   . Smokeless tobacco: Not on file  . Alcohol Use: Yes     Comment: occ, 4 drinks about 1-2 time sper month  . Drug Use: No  . Sexual Activity: Not on file   Other Topics Concern  . 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  FLUoxetine (PROZAC) 20 MG tablet, Take 2 tablets (40 mg total) by mouth daily., Disp: 60 tablet, Rfl: 1 .  Multiple Vitamins-Minerals (MULTIVITAL) tablet, Take 1 tablet by mouth daily., Disp: , Rfl:  .  omeprazole (PRILOSEC) 20 MG capsule, Take 1 capsule (20 mg total) by mouth daily., Disp: 90 capsule, Rfl: 3  EXAM:  Filed Vitals:   11/29/14 0915  BP: 120/72  Pulse: 85  Temp: 98.3 F (36.8 C)    Body mass index is 48.68 kg/(m^2).  GENERAL: vitals reviewed and listed above, alert, oriented, appears well hydrated and in no acute distress  HEENT: atraumatic, conjunttiva clear, no obvious abnormalities on inspection of external nose and ears, normal appearance of ear canals and TMs, clear nasal congestion, mild post oropharyngeal erythema with PND, no tonsillar edema or exudate, no sinus TTP  NECK: no obvious masses on inspection  LUNGS: clear to auscultation bilaterally, no wheezes, rales or rhonchi, good air movement  CV: HRRR, no peripheral edema  MS: moves all extremities without noticeable abnormality  PSYCH: pleasant and cooperative, no obvious depression or anxiety  ASSESSMENT AND PLAN:  Discussed the following assessment and plan:  Anxiety and Depression -discussed options -increase prozac to  daily -advised CBT with Dr. Jason Fila - number provided to  call -advised good sleep and regular exercise -follow up in 1 month advised, sooner if worseneing or concerns  BMI 45.0-49.9, adult -congratulated on lifestyle changes and encouraged lifelong adherence ot healthy lifestyle -advised lipid and diabetes check, not fasting and he opted to do at follow up  Pain in joint, ankle and foot, left -resolved  Acute upper respiratory infection -supportive care, return precautions  -Patient advised to return or notify a doctor immediately if symptoms worsen or persist or new concerns arise.  Patient Instructions  BEFORE YOU LEAVE: -schedule follow up in 1 month - COME FASTING but drink plenty of water -brochure with Dr. Janalyn Shy information  Increase the prozac to 40 mg (2 tablets) daily  Schedule an appointment with Dr. Jason Fila  INSTRUCTIONS FOR UPPER RESPIRATORY INFECTION:  -plenty of rest and fluids  -nasal saline wash 2-3 times daily (use prepackaged nasal saline or bottled/distilled water if making your own)   -can use AFRIN nasal spray for drainage and nasal congestion - but do NOT use longer then 3-4 days  -can use tylenol (in no history of liver disease) or ibuprofen (if no history of kidney disease, bowel bleeding or significant heart disease) as directed for aches and sorethroat  -in the winter time, using a humidifier at night is helpful (please follow cleaning instructions)  -if you are taking a cough medication - use only as directed, may also try a teaspoon of honey to coat the throat and throat lozenges. If given a cough medication with codeine or hydrocodone or other narcotic please be advised that this contains a strong and  potentially addicting medication. Please follow instructions carefully, take as little as possible and only use AS NEEDED for severe cough. Discuss potential side effects with your pharmacy. Please do not drive or operate machinery while taking these types of medications. Please do not take other sedating  medications, drugs or alcohol while taking this medication without discussing with your doctor.  -for sore throat, salt water gargles can help  -follow up if you have fevers, facial pain, tooth pain, difficulty breathing or are worsening or symptoms persist longer then expected  Upper Respiratory Infection, Adult An upper respiratory infection (URI) is also known as the common cold. It is often caused by a type of germ (virus). Colds are easily spread (contagious). You can pass it to others by kissing, coughing, sneezing, or drinking out of the same glass. Usually, you get better in 1 to 3  weeks.  However, the cough can last for even longer. HOME CARE   Only take medicine as told by your doctor. Follow instructions provided above.  Drink enough water and fluids to keep your pee (urine) clear or pale yellow.  Get plenty of  rest.  Return to work when your temperature is < 100 for 24 hours or as told by your doctor. You may use a face mask and wash your hands to stop your cold from spreading. GET HELP RIGHT AWAY IF:   After the first few days, you feel you are getting worse.  You have questions about your medicine.  You have chills, shortness of breath, or red spit (mucus).  You have pain in the face for more then 1-2 days, especially when you bend forward.  You have a fever, puffy (swollen) neck, pain when you swallow, or white spots in the back of your throat.  You have a bad headache, ear pain, sinus pain, or chest pain.  You have a high-pitched whistling sound when you breathe in and out (wheezing).  You cough up blood.  You have sore muscles or a stiff neck. MAKE SURE YOU:   Understand these instructions.  Will watch your condition.  Will get help right away if you are not doing well or get worse. Document Released: 12/10/2007 Document Revised: 09/15/2011 Document Reviewed: 09/28/2013 [facility] Patient Information 2015 [place], [place]. This information is not intended to  replace advice given to you by your health care provider. Make sure you discuss any questions you have with your health care provider.      Kriste BasqueKIM, Linden Tagliaferro R.

## 2014-12-27 ENCOUNTER — Ambulatory Visit (INDEPENDENT_AMBULATORY_CARE_PROVIDER_SITE_OTHER): Payer: BLUE CROSS/BLUE SHIELD | Admitting: Family Medicine

## 2014-12-27 DIAGNOSIS — R69 Illness, unspecified: Secondary | ICD-10-CM

## 2014-12-27 NOTE — Progress Notes (Signed)
NO SHOW

## 2015-03-01 ENCOUNTER — Encounter: Payer: Self-pay | Admitting: Family Medicine

## 2015-03-01 ENCOUNTER — Encounter: Payer: Self-pay | Admitting: *Deleted

## 2015-03-01 ENCOUNTER — Ambulatory Visit (INDEPENDENT_AMBULATORY_CARE_PROVIDER_SITE_OTHER): Payer: BLUE CROSS/BLUE SHIELD | Admitting: Family Medicine

## 2015-03-01 VITALS — BP 118/72 | HR 96 | Temp 98.1°F | Ht 71.0 in | Wt 352.8 lb

## 2015-03-01 DIAGNOSIS — R400 Somnolence: Secondary | ICD-10-CM

## 2015-03-01 DIAGNOSIS — G4710 Hypersomnia, unspecified: Secondary | ICD-10-CM

## 2015-03-01 DIAGNOSIS — G4700 Insomnia, unspecified: Secondary | ICD-10-CM | POA: Diagnosis not present

## 2015-03-01 NOTE — Progress Notes (Signed)
Pre visit review using our clinic review tool, if applicable. No additional management support is needed unless otherwise documented below in the visit note. 

## 2015-03-01 NOTE — Progress Notes (Signed)
HPI:  Insomnia: -works third shift -unfortunately always gives up sleep and only get 4-5 hours of sleep per day - kids go back to school next week and this will help -he has excessive daytime somnolence,especially the last several days, admits to snoring, ? Apneic spells but does not want to do testing now -looking for a new job -fighting a cold and felt to tired to go to work last night -has not been tested for sleep apnea -denies: fevers, SOB, DOE  ROS: See pertinent positives and negatives per HPI.  Past Medical History  Diagnosis Date  . Chicken pox   . Anxiety and depression     never hospitalized, remote  . Obesity   . Plantar fasciitis     calcaneal bone spur    Past Surgical History  Procedure Laterality Date  . Tonsillectomy      age 39     Family History  Problem Relation Age of Onset  . Colon cancer    . Prostate cancer    . Hypertension Mother   . Diabetes Mother   . High Cholesterol Mother   . Healthy Father   . Healthy Sister   . Healthy Brother   . Healthy Brother   . Healthy Brother     Social History   Social History  . Marital Status: Legally Separated    Spouse Name: N/A  . Number of Children: N/A  . Years of Education: N/A   Social History Main Topics  . Smoking status: Never Smoker   . Smokeless tobacco: None  . Alcohol Use: Yes     Comment: occ, 4 drinks about 1-2 time sper month  . Drug Use: No  . Sexual Activity: Not Asked   Other Topics Concern  . 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  omeprazole (PRILOSEC) 20 MG capsule, Take 1 capsule (20 mg total) by mouth daily., Disp: 90 capsule, Rfl: 3  EXAM:  Filed Vitals:   03/01/15 0914  BP: 118/72  Pulse: 96  Temp: 98.1 F (36.7 C)    Body mass index is 49.23 kg/(m^2).  GENERAL: vitals reviewed and listed above, alert, oriented, appears well hydrated and in no acute distress  HEENT: atraumatic, conjunttiva clear, no obvious abnormalities on inspection of external nose and ears  NECK: no obvious masses on inspection  LUNGS: clear to auscultation bilaterally, no wheezes, rales or rhonchi, good air movement  CV: HRRR, no peripheral edema  MS: moves all extremities without noticeable abnormality  PSYCH: pleasant and cooperative, no obvious depression or anxiety  ASSESSMENT AND PLAN:  Discussed the following assessment and plan:  Insomnia  Daytime somnolence  -work excuse for last night and tonight to catch up on some sleep given poor sleep recently and fighting cold -advised testing for sleep apnea, he agreed to do this at some point but not now -advise adjusting schedule and allowing  a minimum of 7 hours per dy for sleep - he thinks he wil be able to do this starting next week -Patient advised to return or notify a doctor immediately if symptoms worsen or persist or new concerns arise.  There are no Patient Instructions on file for this visit.   Kriste Basque R.

## 2015-03-01 NOTE — Patient Instructions (Signed)
BEFORE YOU LEAVE: -follow up in 3 months for physical exam, please come fasting -work note - out until 03/03/15 -offer flu vaccine  Please get at least 7 hours of sleep per night  Please let us know when you want to do testing for sleep apnea  We recommend the following healthy lifestyle measures: - eat a healthy diet consisting of lots of vegetables, fruits, beans, nuts, seeds, healthy meats such as white chicken and fish and whole grains.  - avoid fried foods, fast food, processed foods, sodas, red meet and other fattening foods.  - get a least 150 minutes of aerobic exercise per week.

## 2015-05-16 ENCOUNTER — Ambulatory Visit (INDEPENDENT_AMBULATORY_CARE_PROVIDER_SITE_OTHER): Payer: BLUE CROSS/BLUE SHIELD | Admitting: Family Medicine

## 2015-05-16 DIAGNOSIS — R69 Illness, unspecified: Secondary | ICD-10-CM

## 2015-05-16 NOTE — Progress Notes (Signed)
No show

## 2015-07-26 ENCOUNTER — Telehealth: Payer: Self-pay | Admitting: Family Medicine

## 2015-07-26 NOTE — Telephone Encounter (Signed)
Pt insurance will not cover the following med omeprazole (PRILOSEC) 20 MG capsule   He is asking if he can be switch to [facility]      Pharmacy; [facility]

## 2015-07-27 MED ORDER — LANSOPRAZOLE 15 MG PO CPDR: 15.0000 mg | DELAYED_RELEASE_CAPSULE | Freq: Every day | ORAL | Status: DC

## 2015-07-27 NOTE — Telephone Encounter (Signed)
I called the pt and informed him of the message below and he is aware the Rx was sent to his pharmacy.  Follow up visit scheduled for 1/30.

## 2015-07-27 NOTE — Telephone Encounter (Signed)
Ok to switch to prevacid - but advise the  dose. Can send to pharmacy #[id number], 0 RF.  Also, needs follow up appointment and please advise to arrive 15 minutes early. Thanks.

## 2015-08-06 ENCOUNTER — Ambulatory Visit (INDEPENDENT_AMBULATORY_CARE_PROVIDER_SITE_OTHER): Payer: BLUE CROSS/BLUE SHIELD | Admitting: Family Medicine

## 2015-08-06 DIAGNOSIS — R69 Illness, unspecified: Secondary | ICD-10-CM

## 2015-08-06 NOTE — Progress Notes (Signed)
NO SHOW. On ROC prior to visit:  Paul Sweeney. is a pleasant 40 yo, unfortunately with frequent no shows and poor follow up which makes caring for him challenging.   Obesity: -chronic and worsening -diet and exercise:   GERD: -meds: prevacid   GAD: -started last - on paxil last year briefly, did not follow up thinks didn't help; started prozac 10/2014 and advised CBT -initially generalized anxiety, mildly depressed mood - mild panic occasionally -anxiety has improved, but still with some depression on some days -  -denies: SI, thoughts of self harm -poor sleep patterns related to work and home obligations, advised OSA testing in the past but he refused

## 2015-08-22 ENCOUNTER — Encounter: Payer: Self-pay | Admitting: Family Medicine

## 2015-08-22 ENCOUNTER — Ambulatory Visit (INDEPENDENT_AMBULATORY_CARE_PROVIDER_SITE_OTHER): Payer: Managed Care, Other (non HMO) | Admitting: Family Medicine

## 2015-08-22 VITALS — BP 124/82 | HR 78 | Temp 97.4°F | Ht 71.0 in | Wt 363.2 lb

## 2015-08-22 DIAGNOSIS — Z23 Encounter for immunization: Secondary | ICD-10-CM

## 2015-08-22 DIAGNOSIS — M545 Low back pain, unspecified: Secondary | ICD-10-CM

## 2015-08-22 DIAGNOSIS — Z6841 Body Mass Index (BMI) 40.0 and over, adult: Secondary | ICD-10-CM | POA: Diagnosis not present

## 2015-08-22 MED ORDER — CYCLOBENZAPRINE HCL 10 MG PO TABS: 10.0000 mg | ORAL_TABLET | Freq: Three times a day (TID) | ORAL | Status: DC | PRN

## 2015-08-22 NOTE — Progress Notes (Signed)
Pre visit review using our clinic review tool, if applicable. No additional management support is needed unless otherwise documented below in the visit note. 

## 2015-08-22 NOTE — Progress Notes (Signed)
HPI:  Paul Sweeney. is a pleasant 40 year old male here for an acute visit for:  Back pain: -Started 1-2 months ago, resolved spontaneously, then reoccurred 2 days ago  -Symptoms: Bilateral low back pain, moderate sharp pain with certain activities, worse with getting up and walking or exercise, relieved by rest -Denies: Weaknes, numbness, radiation, bowel or bladder dysfunction, malaise, unexplained weight loss or fevers -No known inciting event -Has been trying to exercise more and this may have triggered the pain   ROS: See pertinent positives and negatives per HPI.  Past Medical History  Diagnosis Date  . Chicken pox   . Anxiety and depression     never hospitalized, remote  . Obesity   . Plantar fasciitis     calcaneal bone spur    Past Surgical History  Procedure Laterality Date  . Tonsillectomy      age 75     Family History  Problem Relation Age of Onset  . Colon cancer    . Prostate cancer    . Hypertension Mother   . Diabetes Mother   . High Cholesterol Mother   . Healthy Father   . Healthy Sister   . Healthy Brother   . Healthy Brother   . Healthy Brother     Social History   Social History  . Marital Status: Legally Separated    Spouse Name: N/A  . Number of Children: N/A  . Years of Education: N/A   Social History Main Topics  . Smoking status: Never Smoker   . Smokeless tobacco: None  . Alcohol Use: Yes     Comment: occ, 4 drinks about 1-2 time sper month  . Drug Use: No  . Sexual Activity: Not Asked   Other Topics Concern  . 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  lansoprazole  (PREVACID) 15 MG capsule, Take 1 capsule (15 mg total) by mouth daily., Disp: 90 capsule, Rfl: 0 .  Multiple Vitamins-Minerals (MULTIVITAL) tablet, Take 1 tablet by mouth daily., Disp: , Rfl:  .  cyclobenzaprine (FLEXERIL) 10 MG tablet, Take 1 tablet (10 mg total) by mouth 3 (three) times daily as needed for muscle spasms., Disp: 30 tablet, Rfl: 0  EXAM:  Filed Vitals:   08/22/15 1041  BP: 124/82  Pulse: 78  Temp: 97.4 F (36.3 C)    Body mass index is 50.68 kg/(m^2).  GENERAL: vitals reviewed and listed above, alert, oriented, appears well hydrated and in no acute distress  HEENT: atraumatic, conjunttiva clear, no obvious abnormalities on inspection of external nose and ears  NECK: no obvious masses on inspection  Normal Gait Normal inspection of back, no obvious scoliosis or leg length descrepancy No bony TTP Soft tissue TTP at: Lumbar paraspinal muscles bilaterally -/+ tests: neg trendelenburg,-facet loading, -SLRT, -CLRT, -FABER, -FADIR Normal muscle strength, sensation to light touch and DTRs in LEs bilaterally  MS: moves all extremities without noticeable abnormality  PSYCH: pleasant and cooperative, no obvious depression or anxiety  ASSESSMENT AND PLAN:  Discussed the following assessment and plan:  Bilateral low back pain without sciatica  BMI 45.0-49.9, adult (HCC)  -we discussed possible serious and likely etiologies, workup and treatment, treatment risks and return precautions -after this discussion, Gleason opted for suspect muscular given reproducible on exam and history, no alarm features or findings -opted for symptomatic care and HEP with close follow up in 1 month -of course, we advised Jahmel  to return or notify a doctor immediately if symptoms worsen or persist or new concerns arise.  -Patient advised to return or notify a doctor immediately if symptoms worsen or persist or new concerns arise.  Patient Instructions  Before you leave: -Low back  exercises -flu shot -Schedule follow up in 1 month, please come fasting and we will plan to do labs that day  Do the low back exercises 3-4 days per week  Can use heat, Aleve per instructions, Tylenol per instructions, topical sports creams and/or Flexeril as needed for pain and muscle spasm  Please check into the prep program at the [facility], and let us know if you would like to do this  We recommend the following healthy lifestyle measures: - eat a healthy whole foods diet consisting of regular small meals composed of vegetables, fruits, beans, nuts, seeds, healthy meats such as white chicken and fish and whole grains.  - avoid sweets, white starchy foods, fried foods, fast food, processed foods, sodas, red meet and other fattening foods.  - get a least 150-300 minutes of aerobic exercise per week.        Kriste Basque R.

## 2015-08-22 NOTE — Patient Instructions (Addendum)
Before you leave: -Low back exercises -flu shot -Schedule follow up in 1 month, please come fasting and we will plan to do labs that day  Do the low back exercises 3-4 days per week  Can use heat, Aleve per instructions, Tylenol per instructions, topical sports creams and/or Flexeril as needed for pain and muscle spasm  Please check into the prep program at the [facility], and let us know if you would like to do this  We recommend the following healthy lifestyle measures: - eat a healthy whole foods diet consisting of regular small meals composed of vegetables, fruits, beans, nuts, seeds, healthy meats such as white chicken and fish and whole grains.  - avoid sweets, white starchy foods, fried foods, fast food, processed foods, sodas, red meet and other fattening foods.  - get a least 150-300 minutes of aerobic exercise per week.

## 2015-09-20 ENCOUNTER — Ambulatory Visit (INDEPENDENT_AMBULATORY_CARE_PROVIDER_SITE_OTHER): Payer: Self-pay | Admitting: Family Medicine

## 2015-09-20 DIAGNOSIS — R69 Illness, unspecified: Secondary | ICD-10-CM

## 2015-09-20 NOTE — Progress Notes (Signed)
NO SHOW

## 2015-11-03 ENCOUNTER — Other Ambulatory Visit: Payer: Self-pay | Admitting: Family Medicine

## 2015-11-24 ENCOUNTER — Other Ambulatory Visit: Payer: Self-pay | Admitting: Family Medicine

## 2015-12-13 ENCOUNTER — Telehealth: Payer: Self-pay | Admitting: General Practice

## 2015-12-13 ENCOUNTER — Other Ambulatory Visit: Payer: Self-pay | Admitting: General Practice

## 2015-12-13 NOTE — Telephone Encounter (Signed)
Left VM for patient to call office about re-fill on Prevacid.  Please see note below.

## 2015-12-13 NOTE — Telephone Encounter (Signed)
Other options would be prilosec 20mg  (available over the counter), over the counter nexium. Ok to change to either and send rx if he wishes. Alternatively he can check to see what his insurance does cover. Thanks.

## 2016-06-26 ENCOUNTER — Encounter: Payer: Self-pay | Admitting: Family Medicine

## 2016-06-26 ENCOUNTER — Ambulatory Visit (INDEPENDENT_AMBULATORY_CARE_PROVIDER_SITE_OTHER): Payer: Managed Care, Other (non HMO) | Admitting: Family Medicine

## 2016-06-26 VITALS — BP 138/80 | HR 89 | Temp 98.5°F | Ht 71.0 in | Wt 362.3 lb

## 2016-06-26 DIAGNOSIS — F411 Generalized anxiety disorder: Secondary | ICD-10-CM | POA: Diagnosis not present

## 2016-06-26 DIAGNOSIS — L0292 Furuncle, unspecified: Secondary | ICD-10-CM | POA: Diagnosis not present

## 2016-06-26 MED ORDER — DOXYCYCLINE HYCLATE 100 MG PO CAPS: 100.0000 mg | ORAL_CAPSULE | Freq: Two times a day (BID) | ORAL | 0 refills | Status: DC

## 2016-06-26 MED ORDER — PAROXETINE HCL 20 MG PO TABS: 20.0000 mg | ORAL_TABLET | Freq: Every day | ORAL | 1 refills | Status: DC

## 2016-06-26 NOTE — Progress Notes (Signed)
HPI:  Acute visit for:  Boil: -chest -started about 1 week ago -drained, now improving some -no spreading, fevers, malaise  Anxiety: -hx of, on benzos and ssri in the past -had improved with dramatic lifestyle changes -no worsened the last few months, daughter was sick with leg fracture -generalized anxiety, worry, sometime overwhelming -denies depression, SI, manic symptoms, panic -he took paxil in the past and it seemed to work well and di not worsen wt per his report  ROS: See pertinent positives and negatives per HPI.  Past Medical History:  Diagnosis Date  . Anxiety and depression    never hospitalized, remote  . Chicken pox   . Obesity   . Plantar fasciitis    calcaneal bone spur    Past Surgical History:  Procedure Laterality Date  . TONSILLECTOMY     age 89     Family History  Problem Relation Age of Onset  . Colon cancer    . Prostate cancer    . Hypertension Mother   . Diabetes Mother   . High Cholesterol Mother   . Healthy Father   . Healthy Sister   . Healthy Brother   . Healthy Brother   . Healthy Brother     Social History   Social History  . Marital status: Legally Separated    Spouse name: N/A  . Number of children: N/A  . Years of education: N/A   Social History Main Topics  . Smoking status: Never Smoker  . Smokeless tobacco: None  . Alcohol use Yes     Comment: occ, 4 drinks about 1-2 time sper month  . Drug use: No  . Sexual activity: Not Asked   Other Topics Concern  . 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  lansoprazole (PREVACID) 15 MG capsule, TAKE 1 CAPSULE (15 MG  TOTAL) BY MOUTH DAILY., Disp: 90 capsule, Rfl: 0 .  Multiple Vitamins-Minerals (MULTIVITAL) tablet, Take 1 tablet by mouth daily., Disp: , Rfl:  .  doxycycline (VIBRAMYCIN) 100 MG capsule, Take 1 capsule (100 mg total) by mouth 2 (two) times daily., Disp: 20 capsule, Rfl: 0 .  PARoxetine (PAXIL) 20 MG tablet, Take 1 tablet (20 mg total) by mouth daily., Disp: 30 tablet, Rfl: 1  EXAM:  Vitals:   06/26/16 1332  BP: 138/80  Pulse: 89  Temp: 98.5 F (36.9 C)    Body mass index is 50.53 kg/m.  GENERAL: vitals reviewed and listed above, alert, oriented, appears well hydrated and in no acute distress  HEENT: atraumatic, conjunttiva clear, no obvious abnormalities on inspection of external nose and ears  NECK: no obvious masses on inspection  LUNGS: clear to auscultation bilaterally, no wheezes, rales or rhonchi, good air movement  CV: HRRR, no peripheral edema  SKIN: small, draining cyst mid chest without fluctuance but with some mild surrounding erythema  MS: moves all extremities without noticeable abnormality  PSYCH: pleasant and cooperative, no obvious depression or anxiety  ASSESSMENT AND PLAN:  Discussed  the following assessment and plan:  Boil -actually more likely an inflamed cyst, now draining on its own, possible mild cellulitis -opted to treat with doxy and compresses -advise UCC or ER over weekend if worsening or systemic symptoms  Generalized anxiety disorder -discussed tx options -advised CBT, he does not seem interested in this -opted to start paxil after discussion risks/benefits -follow up in 1 month  Morbid obesity (HCC) -lifestyle recs  -Patient advised to return or notify a doctor immediately if symptoms worsen or persist or new concerns arise.  Patient Instructions  BEFORE YOU LEAVE: -follow up: 1 month  Take the antibiotic for the skin lesion. Use warm compresses twice daily. Seek care if spreading redness, worsening, fevers, malaise or not  resolving with treatment.  Start the paxil for the anxiety. Take once daily. Do not stop suddenly. Consider counseling. Get regular exercise and eat healthy.    Kriste BasqueKIM, Quaniyah Bugh R., DO

## 2016-06-26 NOTE — Patient Instructions (Signed)
BEFORE YOU LEAVE: -follow up: 1 month  Take the antibiotic for the skin lesion. Use warm compresses twice daily. Seek care if spreading redness, worsening, fevers, malaise or not resolving with treatment.  Start the paxil for the anxiety. Take once daily. Do not stop suddenly. Consider counseling. Get regular exercise and eat healthy.

## 2016-06-26 NOTE — Progress Notes (Signed)
Pre visit review using our clinic review tool, if applicable. No additional management support is needed unless otherwise documented below in the visit note. 

## 2016-07-28 ENCOUNTER — Ambulatory Visit: Payer: Self-pay | Admitting: Family Medicine

## 2016-07-28 NOTE — Progress Notes (Signed)
  HPI:  Follow up:  GAD: -started paxil 06/2016 -reports: -denies:  GERD:  Morbid obesity:   ROS: See pertinent positives and negatives per HPI.  Past Medical History:  Diagnosis Date  . Anxiety and depression    never hospitalized, remote  . Chicken pox   . Obesity   . Plantar fasciitis    calcaneal bone spur    Past Surgical History:  Procedure Laterality Date  . TONSILLECTOMY     age 41     Family History  Problem Relation Age of Onset  . Colon cancer    . Prostate cancer    . Hypertension Mother   . Diabetes Mother   . High Cholesterol Mother   . Healthy Father   . Healthy Sister   . Healthy Brother   . Healthy Brother   . Healthy Brother     Social History   Social History  . Marital status: Legally Separated    Spouse name: N/A  . Number of children: N/A  . Years of education: N/A   Social History Main Topics  . Smoking status: Never Smoker  . Smokeless tobacco: Not on file  . Alcohol use Yes     Comment: occ, 4 drinks about 1-2 time sper month  . Drug use: No  . Sexual activity: Not on file   Other Topics Concern  . Not on file   Social History Narrative   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      Home Situation: lives with kids (12, 8 and 6 in 2014)      Spiritual Beliefs:      Lifestyle: exercises daily - gym 5 days, CV, swimming, weights; diet is improving - son has glut-1 and on adkins type diet, pt has lost 30 lbs in last few months              Current Outpatient Prescriptions:  .  doxycycline (VIBRAMYCIN) 100 MG capsule, Take 1 capsule (100 mg total) by mouth 2 (two) times daily., Disp: 20 capsule, Rfl: 0 .  lansoprazole (PREVACID) 15 MG capsule, TAKE 1 CAPSULE (15 MG TOTAL) BY MOUTH DAILY., Disp: 90 capsule, Rfl: 0 .  Multiple Vitamins-Minerals (MULTIVITAL) tablet, Take 1 tablet by mouth daily., Disp: , Rfl:  .  PARoxetine (PAXIL) 20 MG tablet, Take 1 tablet (20 mg total) by mouth daily., Disp: 30 tablet,  Rfl: 1  EXAM:  There were no vitals filed for this visit.  There is no height or weight on file to calculate BMI.  GENERAL: vitals reviewed and listed above, alert, oriented, appears well hydrated and in no acute distress  HEENT: atraumatic, conjunttiva clear, no obvious abnormalities on inspection of external nose and ears  NECK: no obvious masses on inspection  LUNGS: clear to auscultation bilaterally, no wheezes, rales or rhonchi, good air movement  CV: HRRR, no peripheral edema  MS: moves all extremities without noticeable abnormality  PSYCH: pleasant and cooperative, no obvious depression or anxiety  ASSESSMENT AND PLAN:  Discussed the following assessment and plan:  No diagnosis found.  -Patient advised to return or notify a doctor immediately if symptoms worsen or persist or new concerns arise.  There are no Patient Instructions on file for this visit.  Kriste BasqueKIM, Heidi Maclin R., DO

## 2016-07-31 ENCOUNTER — Encounter: Payer: Self-pay | Admitting: Family Medicine

## 2016-07-31 ENCOUNTER — Ambulatory Visit (INDEPENDENT_AMBULATORY_CARE_PROVIDER_SITE_OTHER): Payer: Managed Care, Other (non HMO) | Admitting: Family Medicine

## 2016-07-31 ENCOUNTER — Encounter: Payer: Self-pay | Admitting: *Deleted

## 2016-07-31 VITALS — BP 128/78 | HR 82 | Temp 97.8°F | Ht 71.0 in | Wt 373.4 lb

## 2016-07-31 DIAGNOSIS — F39 Unspecified mood [affective] disorder: Secondary | ICD-10-CM

## 2016-07-31 DIAGNOSIS — L0292 Furuncle, unspecified: Secondary | ICD-10-CM | POA: Diagnosis not present

## 2016-07-31 DIAGNOSIS — Z8709 Personal history of other diseases of the respiratory system: Secondary | ICD-10-CM

## 2016-07-31 MED ORDER — PAROXETINE HCL 30 MG PO TABS: 30.0000 mg | ORAL_TABLET | Freq: Every day | ORAL | 3 refills | Status: DC

## 2016-07-31 NOTE — Progress Notes (Signed)
Pre visit review using our clinic review tool, if applicable. No additional management support is needed unless otherwise documented below in the visit note. 

## 2016-07-31 NOTE — Progress Notes (Signed)
HPI:   Follow up several issues. See note 06/26/16. Boil has resolved. GAD about the same - now another daughter sick with a cold and this causes stress. feesl current dose paxil not helping much. Denies SI, panic, manic symptoms. Needs work note as had flu last week and missed 2 days and needs note can return to work. No symptoms in 5 days. ROS: See pertinent positives and negatives per HPI.  Past Medical History:  Diagnosis Date  . Anxiety and depression    never hospitalized, remote  . Chicken pox   . Obesity   . Plantar fasciitis    calcaneal bone spur    Past Surgical History:  Procedure Laterality Date  . TONSILLECTOMY     age 89     Family History  Problem Relation Age of Onset  . Colon cancer    . Prostate cancer    . Hypertension Mother   . Diabetes Mother   . High Cholesterol Mother   . Healthy Father   . Healthy Sister   . Healthy Brother   . Healthy Brother   . Healthy Brother     Social History   Social History  . Marital status: Legally Separated    Spouse name: N/A  . Number of children: N/A  . Years of education: N/A   Social History Main Topics  . Smoking status: Never Smoker  . Smokeless tobacco: Never Used  . Alcohol use Yes     Comment: occ, 4 drinks about 1-2 time sper month  . Drug use: No  . Sexual activity: Not Asked   Other Topics Concern  . 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  lansoprazole (PREVACID) 15 MG capsule, TAKE 1 CAPSULE (15 MG TOTAL) BY MOUTH DAILY., Disp: 90 capsule, Rfl: 0 .  Multiple Vitamins-Minerals (MULTIVITAL) tablet, Take 1 tablet by mouth daily., Disp: ,  Rfl:  .  PARoxetine (PAXIL) 30 MG tablet, Take 1 tablet (30 mg total) by mouth daily., Disp: 30 tablet, Rfl: 3  EXAM:  Vitals:   07/31/16 1456  BP: 128/78  Pulse: 82  Temp: 97.8 F (36.6 C)    Body mass index is 52.08 kg/m.  GENERAL: vitals reviewed and listed above, alert, oriented, appears well hydrated and in no acute distress  HEENT: atraumatic, conjunttiva clear, no obvious abnormalities on inspection of external nose and ears  NECK: no obvious masses on inspection  LUNGS: clear to auscultation bilaterally, no wheezes, rales or rhonchi, good air movement  CV: HRRR, no peripheral edema  MS: moves all extremities without noticeable abnormality  PSYCH: pleasant and cooperative, no obvious depression or anxiety  ASSESSMENT AND PLAN:  Discussed the following assessment and plan:  Anxiety and Depression  Boil  Hx of influenza  -Patient advised to return or notify a doctor immediately if symptoms worsen or persist or new concerns arise.  Patient Instructions  BEFORE YOU LEAVE: -follow up: 6 weeks -work note -counseling brochure  Call in 4 weeks to  let us know how this dose of paxil is working.  Consider adding counseling.  We recommend the following healthy lifestyle for LIFE: 1) Small portions.   Tip: eat off of a salad plate instead of a dinner plate.  Tip: It is ok to feel hungry after a meal - that likely means you ate an appropriate portion.  Tip: if you need more or a snack choose fruits, veggies and/or a handful of nuts or seeds.  2) Eat a healthy clean diet.  * Tip: Avoid (less then 1 serving per week): processed foods, sweets, sweetened drinks, white starches (rice, flour, bread, potatoes, pasta, etc), red meat, fast foods, butter  *Tip: CHOOSE instead   * 5-9 servings per day of fresh or frozen fruits and vegetables (but not corn, potatoes, bananas, canned or dried fruit)   *nuts and seeds, beans   *olives and olive oil   *small portions of lean  meats such as fish and white chicken    *small portions of whole grains  3)Get at least 150 minutes of sweaty aerobic exercise per week.  4)Reduce stress - consider counseling, meditation and relaxation to balance other aspects of your life.     Kriste Basque R., DO

## 2016-07-31 NOTE — Patient Instructions (Signed)
BEFORE YOU LEAVE: -follow up: 6 weeks -work note -counseling brochure  Call in 4 weeks to let us know how this dose of paxil is working.  Consider adding counseling.  We recommend the following healthy lifestyle for LIFE: 1) Small portions.   Tip: eat off of a salad plate instead of a dinner plate.  Tip: It is ok to feel hungry after a meal - that likely means you ate an appropriate portion.  Tip: if you need more or a snack choose fruits, veggies and/or a handful of nuts or seeds.  2) Eat a healthy clean diet.  * Tip: Avoid (less then 1 serving per week): processed foods, sweets, sweetened drinks, white starches (rice, flour, bread, potatoes, pasta, etc), red meat, fast foods, butter  *Tip: CHOOSE instead   * 5-9 servings per day of fresh or frozen fruits and vegetables (but not corn, potatoes, bananas, canned or dried fruit)   *nuts and seeds, beans   *olives and olive oil   *small portions of lean meats such as fish and white chicken    *small portions of whole grains  3)Get at least 150 minutes of sweaty aerobic exercise per week.  4)Reduce stress - consider counseling, meditation and relaxation to balance other aspects of your life.

## 2016-08-06 ENCOUNTER — Encounter: Payer: Self-pay | Admitting: Family Medicine

## 2016-08-06 ENCOUNTER — Ambulatory Visit (INDEPENDENT_AMBULATORY_CARE_PROVIDER_SITE_OTHER): Payer: Managed Care, Other (non HMO) | Admitting: Family Medicine

## 2016-08-06 VITALS — BP 122/90 | HR 95 | Temp 98.2°F | Ht 71.0 in | Wt 370.0 lb

## 2016-08-06 DIAGNOSIS — J069 Acute upper respiratory infection, unspecified: Secondary | ICD-10-CM

## 2016-08-06 DIAGNOSIS — B9789 Other viral agents as the cause of diseases classified elsewhere: Secondary | ICD-10-CM | POA: Diagnosis not present

## 2016-08-06 MED ORDER — HYDROCODONE-HOMATROPINE 5-1.5 MG/5ML PO SYRP: 5.0000 mL | ORAL_SOLUTION | Freq: Four times a day (QID) | ORAL | 0 refills | Status: AC | PRN

## 2016-08-06 NOTE — Progress Notes (Signed)
Subjective:     Patient ID: Paul PietyKenneth R Purohit Jr., male   DOB: 1975/09/17, 41 y.o.   MRN: [id number]  HPI   Patient seen with onset 2 days ago of sore throat, chills, cough, body aches, and malaise. No fever. One of his children had similar symptoms last week. Patient is nonsmoker. No dyspnea. No nausea or vomiting. No diarrhea. He took some DayQuil without relief. His cough has been severe at night.  Past Medical History:  Diagnosis Date  . Anxiety and depression    never hospitalized, remote  . Chicken pox   . Obesity   . Plantar fasciitis    calcaneal bone spur   Past Surgical History:  Procedure Laterality Date  . TONSILLECTOMY     age 3     reports that he has never smoked. He has never used smokeless tobacco. He reports that he drinks alcohol. He reports that he does not use drugs. family history includes Diabetes in his mother; Healthy in his brother, brother, brother, father, and sister; High Cholesterol in his mother; Hypertension in his mother. No Known Allergies   Review of Systems  Constitutional: Positive for fatigue.  HENT: Positive for congestion and sore throat.   Respiratory: Positive for cough. Negative for shortness of breath and wheezing.   Cardiovascular: Negative for chest pain.       Objective:   Physical Exam  Constitutional: He appears well-developed and well-nourished.  HENT:  Right Ear: External ear normal.  Left Ear: External ear normal.  Mouth/Throat: Oropharynx is clear and moist.  Neck: Neck supple.  Cardiovascular: Normal rate and regular rhythm.   Pulmonary/Chest: Effort normal and breath sounds normal. No respiratory distress. He has no wheezes. He has no rales.  Lymphadenopathy:    He has no cervical adenopathy.       Assessment:     Probable viral URI with cough. Nonfocal exam    Plan:     -No indication for antibiotic at this time -Hycodan cough syrup 1 teaspoon daily at bedtime for severe coughing -follow up for any fever or  worsening symptoms  Kristian CoveyBruce W Burchette MD [facility]

## 2016-08-06 NOTE — Patient Instructions (Signed)

## 2016-08-26 ENCOUNTER — Telehealth: Payer: Self-pay | Admitting: Family Medicine

## 2016-08-26 DIAGNOSIS — G4733 Obstructive sleep apnea (adult) (pediatric): Secondary | ICD-10-CM

## 2016-08-26 NOTE — Telephone Encounter (Signed)
Pt would like to proceed with the sleep study that was discussed previously

## 2016-08-28 NOTE — Telephone Encounter (Signed)
Ok to refer to pulm for OSA

## 2016-08-28 NOTE — Telephone Encounter (Signed)
Order entered for pulmonologist and I called the pt and informed him someone will call with appt info.

## 2016-09-12 ENCOUNTER — Ambulatory Visit: Payer: Self-pay | Admitting: Family Medicine

## 2016-09-12 NOTE — Progress Notes (Deleted)
  HPI:  Follow up:  GAD/mild depression: -meds: increased paxil last visit -advised CBT  Obesity:  GERD: -meds prevacid  ROS: See pertinent positives and negatives per HPI.  Past Medical History:  Diagnosis Date  . Anxiety and depression    never hospitalized, remote  . Chicken pox   . Obesity   . Plantar fasciitis    calcaneal bone spur    Past Surgical History:  Procedure Laterality Date  . TONSILLECTOMY     age 89     Family History  Problem Relation Age of Onset  . Colon cancer    . Prostate cancer    . Hypertension Mother   . Diabetes Mother   . High Cholesterol Mother   . Healthy Father   . Healthy Sister   . Healthy Brother   . Healthy Brother   . Healthy Brother     Social History   Social History  . Marital status: Legally Separated    Spouse name: N/A  . Number of children: N/A  . Years of education: N/A   Social History Main Topics  . Smoking status: Never Smoker  . Smokeless tobacco: Never Used  . Alcohol use Yes     Comment: occ, 4 drinks about 1-2 time sper month  . Drug use: No  . Sexual activity: Not on file   Other Topics Concern  . Not on file   Social History Narrative   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      Home Situation: lives with kids (12, 8 and 6 in 2014)      Spiritual Beliefs:      Lifestyle: exercises daily - gym 5 days, CV, swimming, weights; diet is improving - son has glut-1 and on adkins type diet, pt has lost 30 lbs in last few months              Current Outpatient Prescriptions:  .  lansoprazole (PREVACID) 15 MG capsule, TAKE 1 CAPSULE (15 MG TOTAL) BY MOUTH DAILY., Disp: 90 capsule, Rfl: 0 .  Multiple Vitamins-Minerals (MULTIVITAL) tablet, Take 1 tablet by mouth daily., Disp: , Rfl:  .  PARoxetine (PAXIL) 30 MG tablet, Take 1 tablet (30 mg total) by mouth daily., Disp: 30 tablet, Rfl: 3  EXAM:  There were no vitals filed for this visit.  There is no height or weight on file to  calculate BMI.  GENERAL: vitals reviewed and listed above, alert, oriented, appears well hydrated and in no acute distress  HEENT: atraumatic, conjunttiva clear, no obvious abnormalities on inspection of external nose and ears  NECK: no obvious masses on inspection  LUNGS: clear to auscultation bilaterally, no wheezes, rales or rhonchi, good air movement  CV: HRRR, no peripheral edema  MS: moves all extremities without noticeable abnormality  PSYCH: pleasant and cooperative, no obvious depression or anxiety  ASSESSMENT AND PLAN:  Discussed the following assessment and plan:  No diagnosis found.  -Patient advised to return or notify a doctor immediately if symptoms worsen or persist or new concerns arise.  There are no Patient Instructions on file for this visit.  Kriste BasqueKIM, Roque Schill R., DO

## 2016-10-10 ENCOUNTER — Institutional Professional Consult (permissible substitution): Payer: Self-pay | Admitting: Internal Medicine

## 2016-10-20 ENCOUNTER — Ambulatory Visit (INDEPENDENT_AMBULATORY_CARE_PROVIDER_SITE_OTHER): Payer: 59 | Admitting: Family Medicine

## 2016-10-20 ENCOUNTER — Encounter: Payer: Self-pay | Admitting: Family Medicine

## 2016-10-20 ENCOUNTER — Encounter: Payer: Self-pay | Admitting: *Deleted

## 2016-10-20 VITALS — BP 118/76 | HR 75 | Temp 97.6°F | Ht 71.0 in | Wt 369.4 lb

## 2016-10-20 DIAGNOSIS — M546 Pain in thoracic spine: Secondary | ICD-10-CM | POA: Diagnosis not present

## 2016-10-20 MED ORDER — CYCLOBENZAPRINE HCL 10 MG PO TABS: 10.0000 mg | ORAL_TABLET | Freq: Every evening | ORAL | 0 refills | Status: DC | PRN

## 2016-10-20 MED ORDER — NAPROXEN 500 MG PO TABS: 500.0000 mg | ORAL_TABLET | Freq: Two times a day (BID) | ORAL | 0 refills | Status: DC | PRN

## 2016-10-20 NOTE — Progress Notes (Signed)
Pre visit review using our clinic review tool, if applicable. No additional management support is needed unless otherwise documented below in the visit note. 

## 2016-10-20 NOTE — Patient Instructions (Signed)
BEFORE YOU LEAVE: -follow up: in 2-4 weeks -work note seen and no heavy lifting/pushing/pulling > 25 lbs for 1 week -low back exercises  Do the exercises 4 days per week.  Flexeril at night if needed for spasm.  Naproxen if needed for pain per instructions (do not use this with any over the counter pain medications)  Topical menthol (tiger balm) and heat can also help.  I hope you are feeling better soon! Seek care immediately if worsening, new concerns or you are not improving with treatment.

## 2016-10-20 NOTE — Progress Notes (Signed)
HPI:  Acute visit for Back Pain: -started about 3 days ago -was wrestling with son and reports - pulled his R mid back muscles, then lifted box for his mother and this seemed to aggravate it with sharp pain in R mid/lower > R back muscles -hx rare flares low back pain, muscle relaxer helped in the past -worse with certain movements; better with heat and ibuprofen -no radiation, weakness, numbness, malaise, fevers, bowel or bladder incontinenece -lumbar plain films in 2002 for this complaint ok -last flare 08/2015 responded well to HEP -PMH sig for morbid obesity, poor compliance, anxiety and depression  ROS: See pertinent positives and negatives per HPI.  Past Medical History:  Diagnosis Date  . Anxiety and depression    never hospitalized, remote  . Chicken pox   . Obesity   . Plantar fasciitis    calcaneal bone spur    Past Surgical History:  Procedure Laterality Date  . TONSILLECTOMY     age 23     Family History  Problem Relation Age of Onset  . Colon cancer    . Prostate cancer    . Hypertension Mother   . Diabetes Mother   . High Cholesterol Mother   . Healthy Father   . Healthy Sister   . Healthy Brother   . Healthy Brother   . Healthy Brother     Social History   Social History  . Marital status: Legally Separated    Spouse name: N/A  . Number of children: N/A  . Years of education: N/A   Social History Main Topics  . Smoking status: Never Smoker  . Smokeless tobacco: Never Used  . Alcohol use Yes     Comment: occ, 4 drinks about 1-2 time sper month  . Drug use: No  . Sexual activity: Not Asked   Other Topics Concern  . 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, 8 and 6 in 2014)      Spiritual Beliefs:      Lifestyle: exercises daily - gym 5 days, CV, swimming, weights; diet is improving - son has glut-1 and on adkins type diet, pt has lost 30 lbs in last  few months              Current Outpatient Prescriptions:  .  lansoprazole (PREVACID) 15 MG capsule, TAKE 1 CAPSULE (15 MG TOTAL) BY MOUTH DAILY., Disp: 90 capsule, Rfl: 0 .  Multiple Vitamins-Minerals (MULTIVITAL) tablet, Take 1 tablet by mouth daily., Disp: , Rfl:  .  PARoxetine (PAXIL) 30 MG tablet, Take 1 tablet (30 mg total) by mouth daily., Disp: 30 tablet, Rfl: 3 .  cyclobenzaprine (FLEXERIL) 10 MG tablet, Take 1 tablet (10 mg total) by mouth at bedtime as needed for muscle spasms., Disp: 30 tablet, Rfl: 0 .  naproxen (NAPROSYN) 500 MG tablet, Take 1 tablet (500 mg total) by mouth 2 (two) times daily as needed for moderate pain., Disp: 20 tablet, Rfl: 0  EXAM:  Vitals:   10/20/16 1609  BP: 118/76  Pulse: 75  Temp: 97.6 F (36.4 C)    Body mass index is 51.52 kg/m.  GENERAL: vitals reviewed and listed above, alert, oriented, appears well hydrated and in no acute distress  HEENT: atraumatic, conjunttiva clear, no obvious abnormalities on inspection of external nose and ears  NECK: no obvious masses on inspection  LUNGS: clear  to auscultation bilaterally, no wheezes, rales or rhonchi, good air movement  CV: HRRR, no peripheral edema  MS: moves all extremities without noticeable abnormality Normal Gait Normal inspection of back, no obvious scoliosis or leg length descrepancy No bony TTP Soft tissue TTP at: R paraspinal muscles lower thoracic musculature -/+ tests: neg trendelenburg,-facet loading, -SLRT, -CLRT, normal gait   PSYCH: pleasant and cooperative, no obvious depression or anxiety  ASSESSMENT AND PLAN:  Discussed the following assessment and plan:  Acute right-sided thoracic back pain  -we discussed possible serious and likely etiologies, workup and treatment, treatment risks and return precautions; suspect musculat in etiology -after this discussion, Ashok opted for muscle relaxer, heat, prn naproxen, HEP -work note, no heavy  lifting/pushing/pulling > 25 lbs for 1 week -follow up advised 2-4 weeks or sooner if not improving, symptoms worsen or new concerns arise.   Patient Instructions  BEFORE YOU LEAVE: -follow up: in 2-4 weeks -work note seen and no heavy lifting/pushing/pulling > 25 lbs for 1 week -low back exercises  Do the exercises 4 days per week.  Flexeril at night if needed for spasm.  Naproxen if needed for pain per instructions (do not use this with any over the counter pain medications)  Topical menthol (tiger balm) and heat can also help.  I hope you are feeling better soon! Seek care immediately if worsening, new concerns or you are not improving with treatment.      Kriste Basque R., DO

## 2016-11-20 NOTE — Progress Notes (Signed)
HPI:  Here for CPE:  -Concerns and/or follow up today: none PMH significant for ANxiety, Depression, Morbid obesity, GERd, [name]. Feels tired fairly often chronically - thinks is related to third shift, poor diet, no exercise. Will be transition out of current job  In 1 month, will have more time for a few months to work on diet and exercise and will not be doing 3rd shift. Is excited about this. Doing sleep apnea testing next month.  -Diet: poor diet  -Exercise: no regular exercise  -Diabetes and Dyslipidemia Screening: fasting for labs  -Vaccines: UTD  -wants STI testing, Hep C screening (if born [id number]): no  -FH colon or prstate ca: see FH Last colon cancer screening: n/a Last prostate ca screening: n/a  -Alcohol, Tobacco, drug use: see social history  Review of Systems - no fevers, unintentional weight loss, vision loss, hearing loss, chest pain, sob, hemoptysis, melena, hematochezia, hematuria, genital discharge, changing or concerning skin lesions, bleeding, bruising, loc, thoughts of self harm or SI  Past Medical History:  Diagnosis Date  . Anxiety and depression    never hospitalized, remote  . Chicken pox   . Obesity   . Plantar fasciitis    calcaneal bone spur    Past Surgical History:  Procedure Laterality Date  . TONSILLECTOMY     age 89     Family History  Problem Relation Age of Onset  . Colon cancer Unknown   . Prostate cancer Unknown   . Hypertension Mother   . Diabetes Mother   . High Cholesterol Mother   . Healthy Father   . Healthy Sister   . Healthy Brother   . Healthy Brother   . Healthy Brother     Social History   Social History  . Marital status: Legally Separated    Spouse name: N/A  . Number of children: N/A  . Years of education: N/A   Social History Main Topics  . Smoking status: Never Smoker  . Smokeless tobacco: Never Used  . Alcohol use Yes     Comment: occ, 4 drinks about 1-2 time sper month  . Drug use: No  .  Sexual activity: Not Asked   Other Topics Concern  . 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  lansoprazole (PREVACID) 15 MG capsule, TAKE 1 CAPSULE (15 MG TOTAL) BY MOUTH DAILY., Disp: 90 capsule, Rfl: 0 .  Multiple Vitamins-Minerals (MULTIVITAL) tablet, Take 1 tablet by mouth daily., Disp: , Rfl:  .  PARoxetine (PAXIL) 30 MG tablet, Take 1 tablet (30 mg total) by mouth daily., Disp: 30 tablet, Rfl: 3  EXAM:  Vitals:   11/21/16 1112  BP: 110/80  Pulse: 69  Temp: 97.7 F (36.5 C)  TempSrc: Oral  Weight: (!) 377 lb 6.4 oz (171.2 kg)  Height: 5\' 11"  (1.803 m)    Estimated body mass index is 52.64 kg/m as calculated from the following:   Height as of this encounter: 5\' 11"  (1.803 m).   Weight  as of this encounter: 377 lb 6.4 oz (171.2 kg).  GENERAL: vitals reviewed and listed below, alert, oriented, appears well hydrated and in no acute distress  HEENT: head atraumatic, PERRLA, normal appearance of eyes, ears, nose and mouth. moist mucus membranes.  NECK: supple, no masses or lymphadenopathy  LUNGS: clear to auscultation bilaterally, no rales, rhonchi or wheeze  CV: HRRR, no peripheral edema or cyanosis, normal pedal pulses  ABDOMEN: bowel sounds normal, soft, non tender to palpation, no masses, no rebound or guarding  GU: normal appearance of external genitalia - no lesions or masses, hernia exam normal.  RECTAL: refused  SKIN: no rash or abnormal lesions  MS: normal gait, moves all extremities normally  NEURO: normal gait, speech and thought processing grossly intact, muscle tone grossly intact throughout  PSYCH:  normal affect, pleasant and cooperative  ASSESSMENT AND PLAN:  Discussed the following assessment and plan:  Encounter for preventive health examination - Plan: Hemoglobin A1c  Daytime somnolence - Plan: Basic metabolic panel, CBC, TSH  Hyperlipidemia, unspecified hyperlipidemia type - Plan: Lipid panel  Anxiety and Depression  Morbid obesity (HCC) - Plan: Hemoglobin A1c  -Discussed and advised all [place] preventive services health task force level A and B recommendations for age, sex and risks.  --Advised at least 150 minutes of exercise per week and a healthy diet with avoidance of (less then 1 serving per week) processed foods, white starches, red meat, fast foods and sweets and consisting of: * 5-9 servings of fresh fruits and vegetables (not corn or potatoes) *nuts and seeds, beans *olives and olive oil *lean meats such as fish and white chicken  *whole grains  -FASTING labs, studies and vaccines per orders this encounter   Patient advised to return to clinic immediately if symptoms worsen or persist or new concerns.  Patient Instructions  BEFORE YOU LEAVE: -follow up: 4-6 months -labs  We have ordered labs or studies at this visit. It can take up to 1-2 weeks for results and processing. IF results require follow up or explanation, we will call you with instructions. Clinically stable results will be released to your [facility]. If you have not heard from [place] or cannot find your results in [facility] in 2 weeks please contact our office at [phone].  If you are not yet signed up for [facility], please consider signing up.   We recommend the following healthy lifestyle for LIFE: 1) Small portions.   Tip: eat off of a salad plate instead of a dinner plate.  Tip: if you need more or a snack choose fruits, veggies and/or a handful of nuts or seeds.  2) Eat a healthy clean diet.  * Tip: Avoid (less then 1 serving per week): processed foods, sweets, sweetened drinks, white starches  (rice, flour, bread, potatoes, pasta, etc), red meat, fast foods, butter  *Tip: CHOOSE instead   * 5-9 servings per day of fresh or frozen fruits and vegetables (but not corn, potatoes, bananas, canned or dried fruit)   *nuts and seeds, beans   *olives and olive oil   *small portions of lean meats such as fish and white chicken    *small portions of whole grains  3)Get at least 150 minutes of sweaty aerobic exercise per week.  4)Reduce stress - consider counseling, meditation and relaxation to balance other aspects of your life.    WE NOW OFFER   [facility] FAST TRACK!!!  SAME DAY Appointments for ACUTE CARE  Such as: Sprains, Injuries, cuts, abrasions, rashes,  muscle pain, joint pain, back pain Colds, flu, sore throats, headache, allergies, cough, fever  Ear pain, sinus and eye infections Abdominal pain, nausea, vomiting, diarrhea, upset stomach Animal/insect bites  3 Easy Ways to Schedule: Walk-In Scheduling Call in scheduling Mychart Sign-up: [email or web address]                No Follow-up on file.   Kriste Basque R., DO

## 2016-11-21 ENCOUNTER — Ambulatory Visit (INDEPENDENT_AMBULATORY_CARE_PROVIDER_SITE_OTHER): Payer: 59 | Admitting: Family Medicine

## 2016-11-21 ENCOUNTER — Encounter: Payer: Self-pay | Admitting: Family Medicine

## 2016-11-21 VITALS — BP 110/80 | HR 69 | Temp 97.7°F | Ht 71.0 in | Wt 377.4 lb

## 2016-11-21 DIAGNOSIS — E785 Hyperlipidemia, unspecified: Secondary | ICD-10-CM

## 2016-11-21 DIAGNOSIS — F39 Unspecified mood [affective] disorder: Secondary | ICD-10-CM

## 2016-11-21 DIAGNOSIS — Z0000 Encounter for general adult medical examination without abnormal findings: Secondary | ICD-10-CM

## 2016-11-21 DIAGNOSIS — D72829 Elevated white blood cell count, unspecified: Secondary | ICD-10-CM | POA: Diagnosis not present

## 2016-11-21 DIAGNOSIS — R400 Somnolence: Secondary | ICD-10-CM

## 2016-11-21 LAB — BASIC METABOLIC PANEL
BUN: 14 mg/dL (ref 6–23)
CHLORIDE: 104 meq/L (ref 96–112)
CO2: 28 meq/L (ref 19–32)
CREATININE: 0.93 mg/dL (ref 0.40–1.50)
Calcium: 9.5 mg/dL (ref 8.4–10.5)
GFR: 94.41 mL/min (ref >60.00)
GLUCOSE: 97 mg/dL (ref 70–99)
POTASSIUM: 4.5 meq/L (ref 3.5–5.1)
Sodium: 140 mEq/L (ref 135–145)

## 2016-11-21 LAB — CBC
HEMATOCRIT: 43.6 % (ref 39.0–52.0)
HEMOGLOBIN: 14.6 g/dL (ref 13.0–17.0)
MCHC: 33.5 g/dL (ref 30.0–36.0)
MCV: 92.5 fl (ref 78.0–100.0)
Platelets: 289 10*3/uL (ref 150.0–400.0)
RBC: 4.72 Mil/uL (ref 4.22–5.81)
RDW: 12.6 % (ref 11.5–15.5)
WBC: 11.7 10*3/uL — ABNORMAL HIGH (ref 4.0–10.5)

## 2016-11-21 LAB — LIPID PANEL
CHOL/HDL RATIO: 5
Cholesterol: 192 mg/dL (ref 0–200)
HDL: 38 mg/dL — ABNORMAL LOW (ref >39.00)
LDL Cholesterol: 128 mg/dL — ABNORMAL HIGH (ref 0–99)
NONHDL: 154.15
Triglycerides: 130 mg/dL (ref 0.0–149.0)
VLDL: 26 mg/dL (ref 0.0–40.0)

## 2016-11-21 LAB — TSH: TSH: 3.45 u[IU]/mL (ref 0.35–4.50)

## 2016-11-21 LAB — HEMOGLOBIN A1C: HEMOGLOBIN A1C: 6.3 % (ref 4.6–6.5)

## 2016-11-21 NOTE — Patient Instructions (Signed)
BEFORE YOU LEAVE: -follow up: 4-6 months -labs  We have ordered labs or studies at this visit. It can take up to 1-2 weeks for results and processing. IF results require follow up or explanation, we will call you with instructions. Clinically stable results will be [facility]. If you have not heard from us or cannot find your results in [facility] in 2 weeks please contact our office at [phone].  If you are not yet signed up for [facility], please consider signing up.   We recommend the following healthy lifestyle for LIFE: 1) Small portions.   Tip: eat off of a salad plate instead of a dinner plate.  Tip: if you need more or a snack choose fruits, veggies and/or a handful of nuts or seeds.  2) Eat a healthy clean diet.  * Tip: Avoid (less then 1 serving per week): processed foods, sweets, sweetened drinks, white starches (rice, flour, bread, potatoes, pasta, etc), red meat, fast foods, butter  *Tip: CHOOSE instead   * 5-9 servings per day of fresh or frozen fruits and vegetables (but not corn, potatoes, bananas, canned or dried fruit)   *nuts and seeds, beans   *olives and olive oil   *small portions of lean meats such as fish and white chicken    *small portions of whole grains  3)Get at least 150 minutes of sweaty aerobic exercise per week.  4)Reduce stress - consider counseling, meditation and relaxation to balance other aspects of your life.    WE NOW OFFER   [facility] FAST TRACK!!!  SAME DAY Appointments for ACUTE CARE  Such as: Sprains, Injuries, cuts, abrasions, rashes, muscle pain, joint pain, back pain Colds, flu, sore throats, headache, allergies, cough, fever  Ear pain, sinus and eye infections Abdominal pain, nausea, vomiting, diarrhea, upset stomach Animal/insect bites  3 Easy Ways to Schedule: Walk-In Scheduling Call in scheduling Mychart Sign-up: [email or web address]

## 2016-11-27 NOTE — Addendum Note (Signed)
Addended by: Johnella MoloneyFUNDERBURK, JO A on: 11/27/2016 06:03 PM   Modules accepted: Orders

## 2016-12-04 ENCOUNTER — Ambulatory Visit (INDEPENDENT_AMBULATORY_CARE_PROVIDER_SITE_OTHER): Payer: 59 | Admitting: Internal Medicine

## 2016-12-04 ENCOUNTER — Encounter: Payer: Self-pay | Admitting: Internal Medicine

## 2016-12-04 VITALS — BP 128/82 | HR 78 | Ht 71.0 in | Wt 370.4 lb

## 2016-12-04 DIAGNOSIS — G4733 Obstructive sleep apnea (adult) (pediatric): Secondary | ICD-10-CM

## 2016-12-04 NOTE — Progress Notes (Signed)
12/04/16-41 year old male never smoker for sleep medicine evaluation-SLEEP CONSULT referring MD is Dr. Kriste BasqueHannah Kim. 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 Kids described very loud snoring and he says he wakes gasping. Never feels well rested-sleeps seems restless but he denies significant body movement, parasomnias or other sleep disturbance. 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 Uses energy drinks and coffee to help stay awake. ENT surgery limited to tonsils with no heart or lung disorder known. Epworth score 8/24.  Prior to Admission medications   Medication Sig Start Date End Date Taking? Authorizing Provider  Multiple Vitamins-Minerals (MULTIVITAL) tablet Take 1 tablet by mouth daily.   Yes [provider]  PARoxetine (PAXIL) 30 MG tablet Take 1 tablet (30 mg total) by mouth daily. 07/31/16  Yes Terressa KoyanagiKim, Hannah R, DO   Past Medical History:  Diagnosis Date  . Anxiety and depression    never hospitalized, remote  . Chicken pox   . Obesity   . Plantar fasciitis    calcaneal bone spur   Past Surgical History:  Procedure Laterality Date  . TONSILLECTOMY     age 89    Family History  Problem Relation Age of Onset  . Colon cancer Unknown   . Prostate cancer Unknown   . Hypertension Mother   . Diabetes Mother   . High Cholesterol Mother   . Healthy Father   . Healthy Sister   . Healthy Brother   . Healthy Brother   . Healthy Brother    Social History   Social History  . Marital status: Legally Separated    Spouse name: N/A  . Number of children: N/A  . Years of education: N/A   Occupational History  . Not on file.   Social History Main Topics  . Smoking status: Never Smoker  . Smokeless tobacco: Never Used  . Alcohol use Yes     Comment: occ, 4 drinks about 1-2 time  sper month  . Drug use: No  . Sexual activity: Not on file   Other Topics Concern  . 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 HEENT:    headaches, difficulty swallowing, tooth/dental problems, sore throat,       sneezing, itching, ear ache, nasal congestion, post nasal drip, snoring CV:    chest pain, orthopnea, PND, swelling in lower extremities, anasarca,                                                   dizziness, palpitations Resp:   shortness of breath with exertion or  at rest.                productive cough,   non-productive cough, coughing up of blood.              change in color of mucus.  wheezing.   Skin:    rash or lesions. GI:  No-   heartburn, indigestion, abdominal pain, nausea, vomiting, diarrhea,                 change in bowel habits, loss of appetite GU: dysuria, change in color of urine, no urgency or frequency.   flank pain. MS:   joint pain, stiffness, decreased range of motion, back pain. Neuro-     nothing unusual Psych:  change in mood or affect.  +depression or anxiety.   memory loss.  OBJ- Physical Exam General- Alert, Oriented, Affect-appropriate, Distress- none acute, + morbidly obese Skin- rash-none, lesions- none, excoriation- none Lymphadenopathy- none Head- atraumatic            Eyes- Gross vision intact, PERRLA, conjunctivae and secretions clear            Ears- Hearing, canals-normal            Nose- Clear, no-Septal dev, mucus, polyps, erosion, perforation             Throat- Mallampati II-III , mucosa clear , drainage-  none, tonsils- atrophic, own teeth Neck- flexible , trachea midline, no stridor , thyroid nl, carotid no bruit Chest - symmetrical excursion , unlabored           Heart/CV- RRR , no murmur , no gallop  , no rub, nl s1 s2                           - JVD- none , edema- none, stasis changes- none, varices- none           Lung- clear to P&A, wheeze- none, cough- none , dullness-none, rub- none           Chest wall-  Abd-  Br/ Gen/ Rectal- Not done, not indicated Extrem- cyanosis- none, clubbing, none, atrophy- none, strength- nl Neuro- grossly intact to observation

## 2016-12-04 NOTE — Assessment & Plan Note (Signed)
High probability diagnosis based on history and exam. Major contributing factor is obesity. Third shift job ascending this week and he will be returning to a daytime schedule. We discussed basic sleep hygiene, the physiology and medical concerns of obstructive sleep apnea, the importance of achieving normal body weight and his responsibility to be alert while driving. Plan-sleep study

## 2016-12-04 NOTE — Addendum Note (Signed)
Addended by: Jetty DuhamelYOUNG, Germaine Shenker D on: 12/04/2016 10:33 AM   Modules accepted: Level of Service

## 2016-12-04 NOTE — Assessment & Plan Note (Signed)
Control of sleep apnea and return to a dayshift job may help him get started on a useful weight loss program. Consider bariatric referral.

## 2016-12-04 NOTE — Patient Instructions (Signed)
Order- schedule unattended home sleep test     Dx  OS[facility]  My staff will contact you to set a time to return to review results of your sleep test  Please call as needed

## 2016-12-22 ENCOUNTER — Other Ambulatory Visit (INDEPENDENT_AMBULATORY_CARE_PROVIDER_SITE_OTHER): Payer: 59

## 2016-12-22 DIAGNOSIS — D72829 Elevated white blood cell count, unspecified: Secondary | ICD-10-CM

## 2016-12-22 LAB — CBC WITH DIFFERENTIAL/PLATELET
BASOS ABS: 0.1 10*3/uL (ref 0.0–0.1)
Basophils Relative: 0.6 % (ref 0.0–3.0)
Eosinophils Absolute: 0.3 10*3/uL (ref 0.0–0.7)
Eosinophils Relative: 3.4 % (ref 0.0–5.0)
HEMATOCRIT: 45.8 % (ref 39.0–52.0)
Hemoglobin: 14.9 g/dL (ref 13.0–17.0)
LYMPHS ABS: 2.5 10*3/uL (ref 0.7–4.0)
Lymphocytes Relative: 26.6 % (ref 12.0–46.0)
MCHC: 32.6 g/dL (ref 30.0–36.0)
MCV: 93.4 fl (ref 78.0–100.0)
MONO ABS: 0.8 10*3/uL (ref 0.1–1.0)
MONOS PCT: 8.4 % (ref 3.0–12.0)
Neutro Abs: 5.7 10*3/uL (ref 1.4–7.7)
Neutrophils Relative %: 61 % (ref 43.0–77.0)
PLATELETS: 298 10*3/uL (ref 150.0–400.0)
RBC: 4.9 Mil/uL (ref 4.22–5.81)
RDW: 12.6 % (ref 11.5–15.5)
WBC: 9.4 10*3/uL (ref 4.0–10.5)

## 2016-12-24 DIAGNOSIS — G4733 Obstructive sleep apnea (adult) (pediatric): Secondary | ICD-10-CM | POA: Diagnosis not present

## 2016-12-25 DIAGNOSIS — G4733 Obstructive sleep apnea (adult) (pediatric): Secondary | ICD-10-CM | POA: Diagnosis not present

## 2017-01-09 ENCOUNTER — Telehealth: Payer: Self-pay | Admitting: Internal Medicine

## 2017-01-09 DIAGNOSIS — G4733 Obstructive sleep apnea (adult) (pediatric): Secondary | ICD-10-CM

## 2017-01-09 NOTE — Telephone Encounter (Signed)
lmtcb X1 for pt. Will order cpap after speaking to pt.  No rov currently scheduled.

## 2017-01-09 NOTE — Telephone Encounter (Signed)
The home sleep test confirms severe obstructive sleep apnea, with breathing interrupted 44 times per hour on average.  The best treatment for this is CPAP, using air pressure mask to keep the airway open  Order- new DME, new CPAP auto 5-20, mask of choice, humidifier, supplies, AirView  Please set a definite return ov in 3 months

## 2017-01-09 NOTE — Telephone Encounter (Signed)
CY pt is calling for the results of the sleep study.  Please advise of these results. Thanks

## 2017-01-12 NOTE — Telephone Encounter (Signed)
lmomtcb x 2  

## 2017-01-13 NOTE — Telephone Encounter (Signed)
Patient returning call - he can be reached at [phone] -pr

## 2017-01-13 NOTE — Telephone Encounter (Signed)
Called and spoke with pt and he is aware of results per CY.  I have placed the order to get the pt set up with cpap with DME.  Will forward back to Katie to follow up on 3 month follow up with pt after getting set up  With cpap.

## 2017-01-15 NOTE — Telephone Encounter (Signed)
[phone] pt calling back

## 2017-01-15 NOTE — Telephone Encounter (Signed)
Spoke with pt. He has been scheduled with CY on 04/20/2017 at 3:30pm. Nothing further was needed.

## 2017-01-15 NOTE — Telephone Encounter (Signed)
lmom tcb x1 Pt needs a 3 month ov with CY

## 2017-02-11 IMAGING — CR DG FOOT COMPLETE 3+V*L*
3 series · 3 of 3 positions shown · non-contrast
Comparison: None.

CLINICAL DATA: Left heel pain 3 months.  No injury.

EXAM:
LEFT FOOT - COMPLETE 3+ VIEW

[t foot ap left]
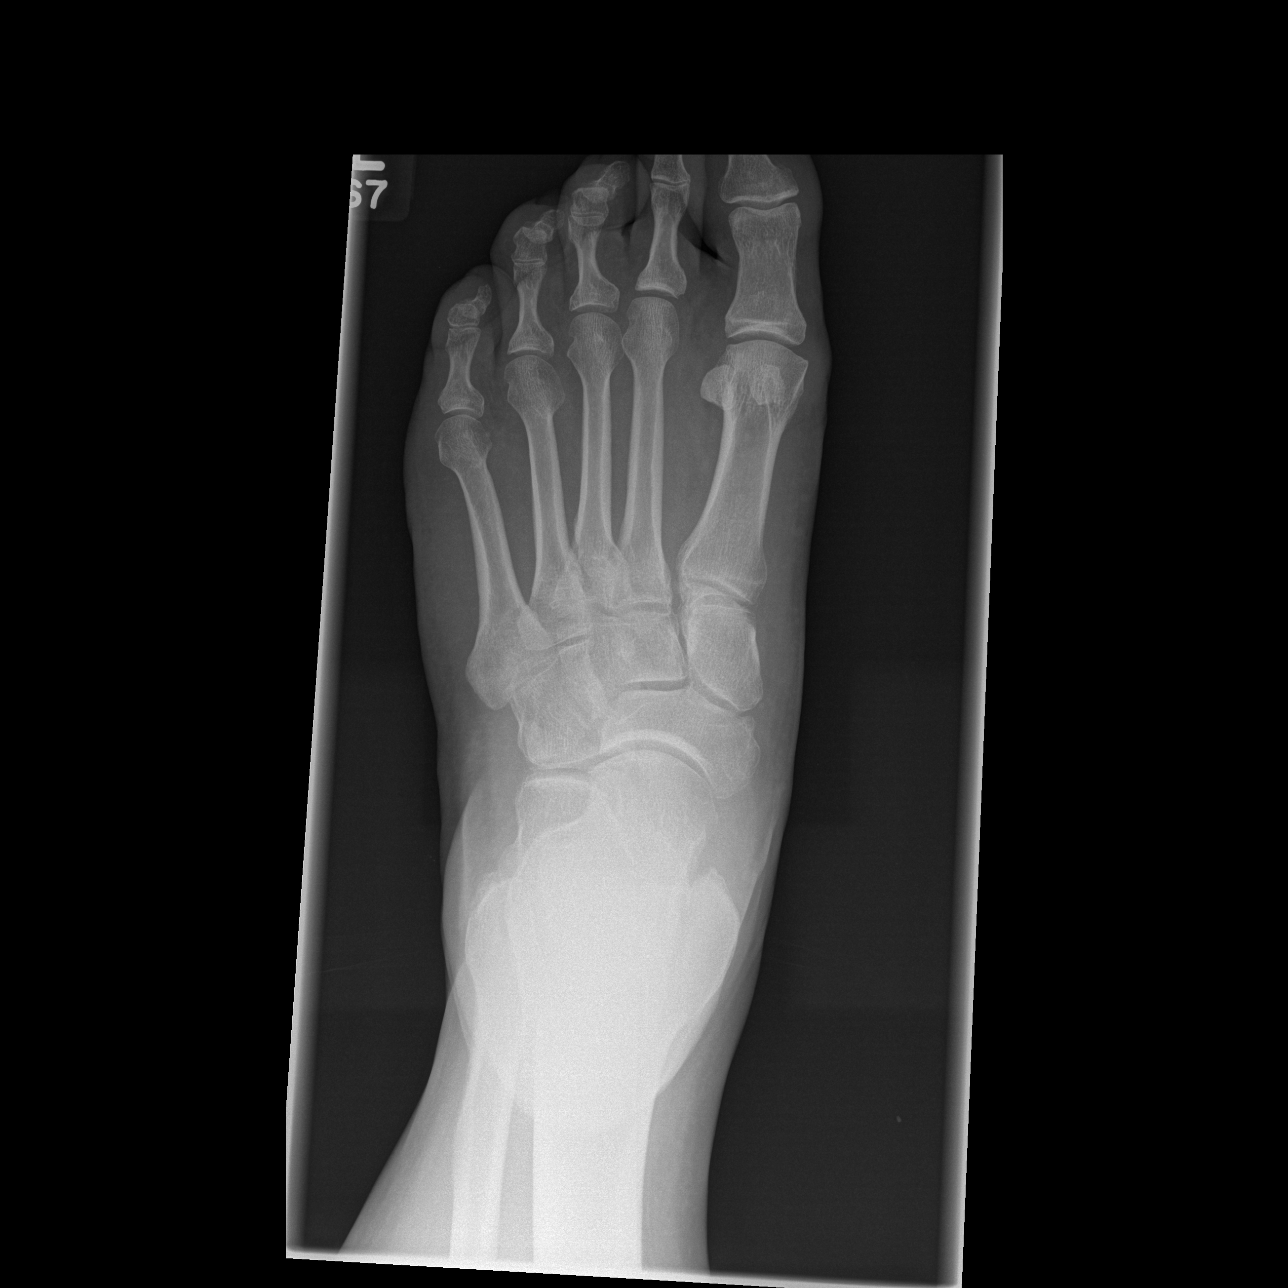

[t foot oblique left]
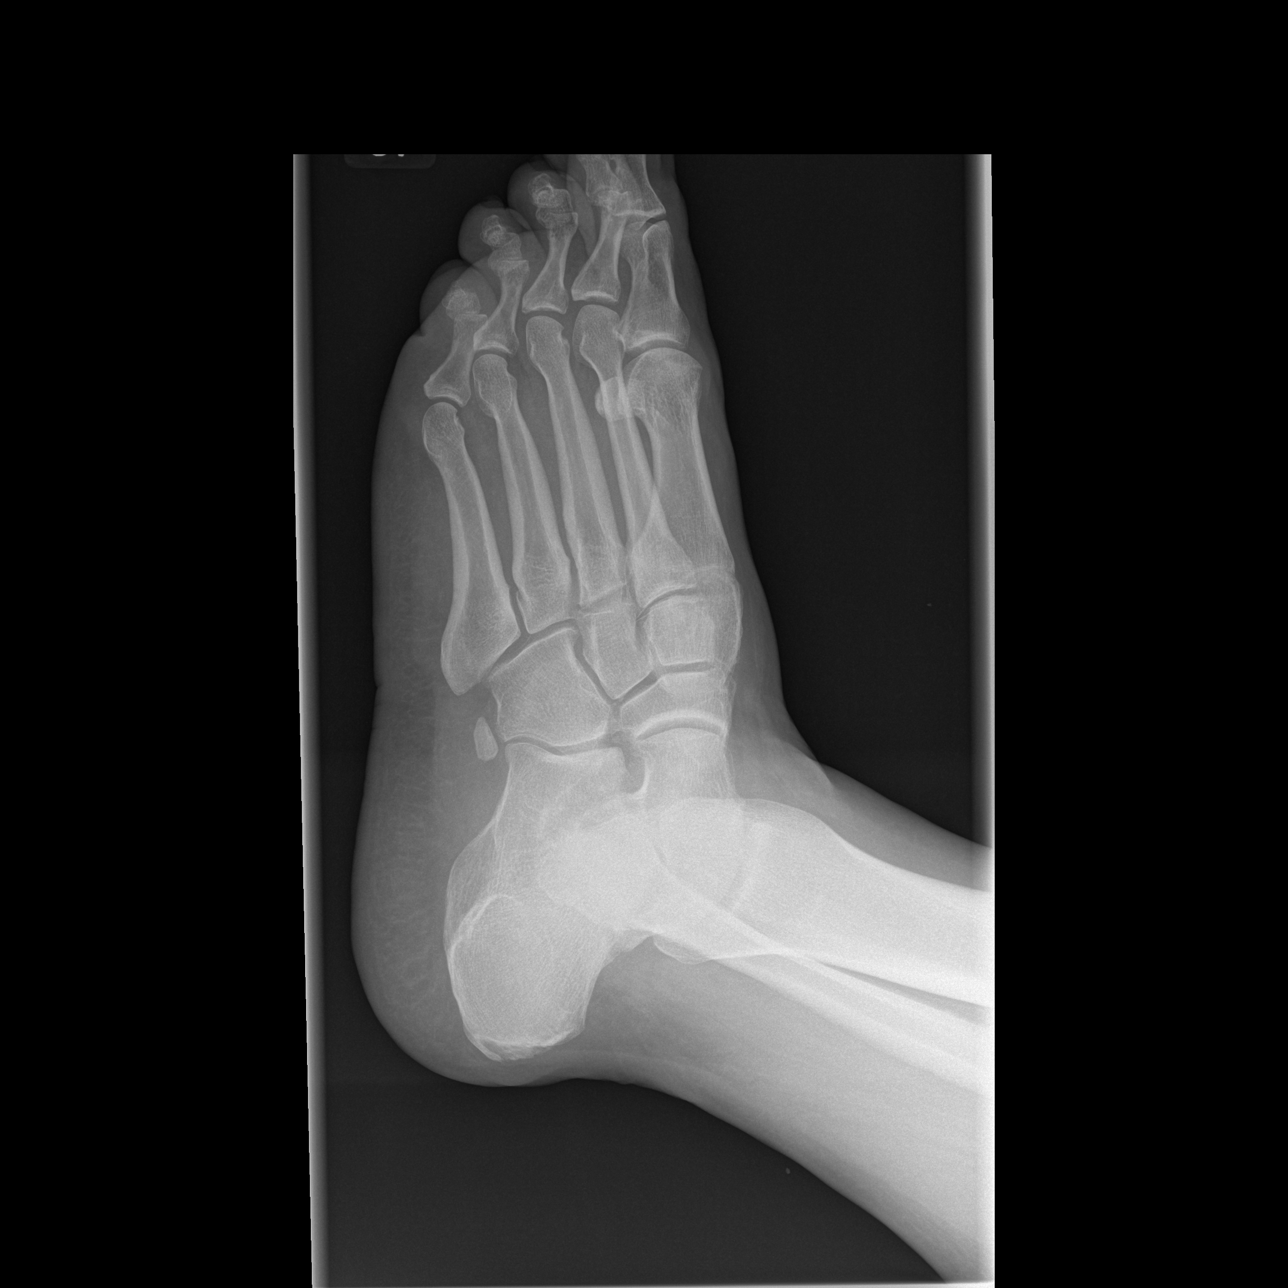

[t foot lat left]
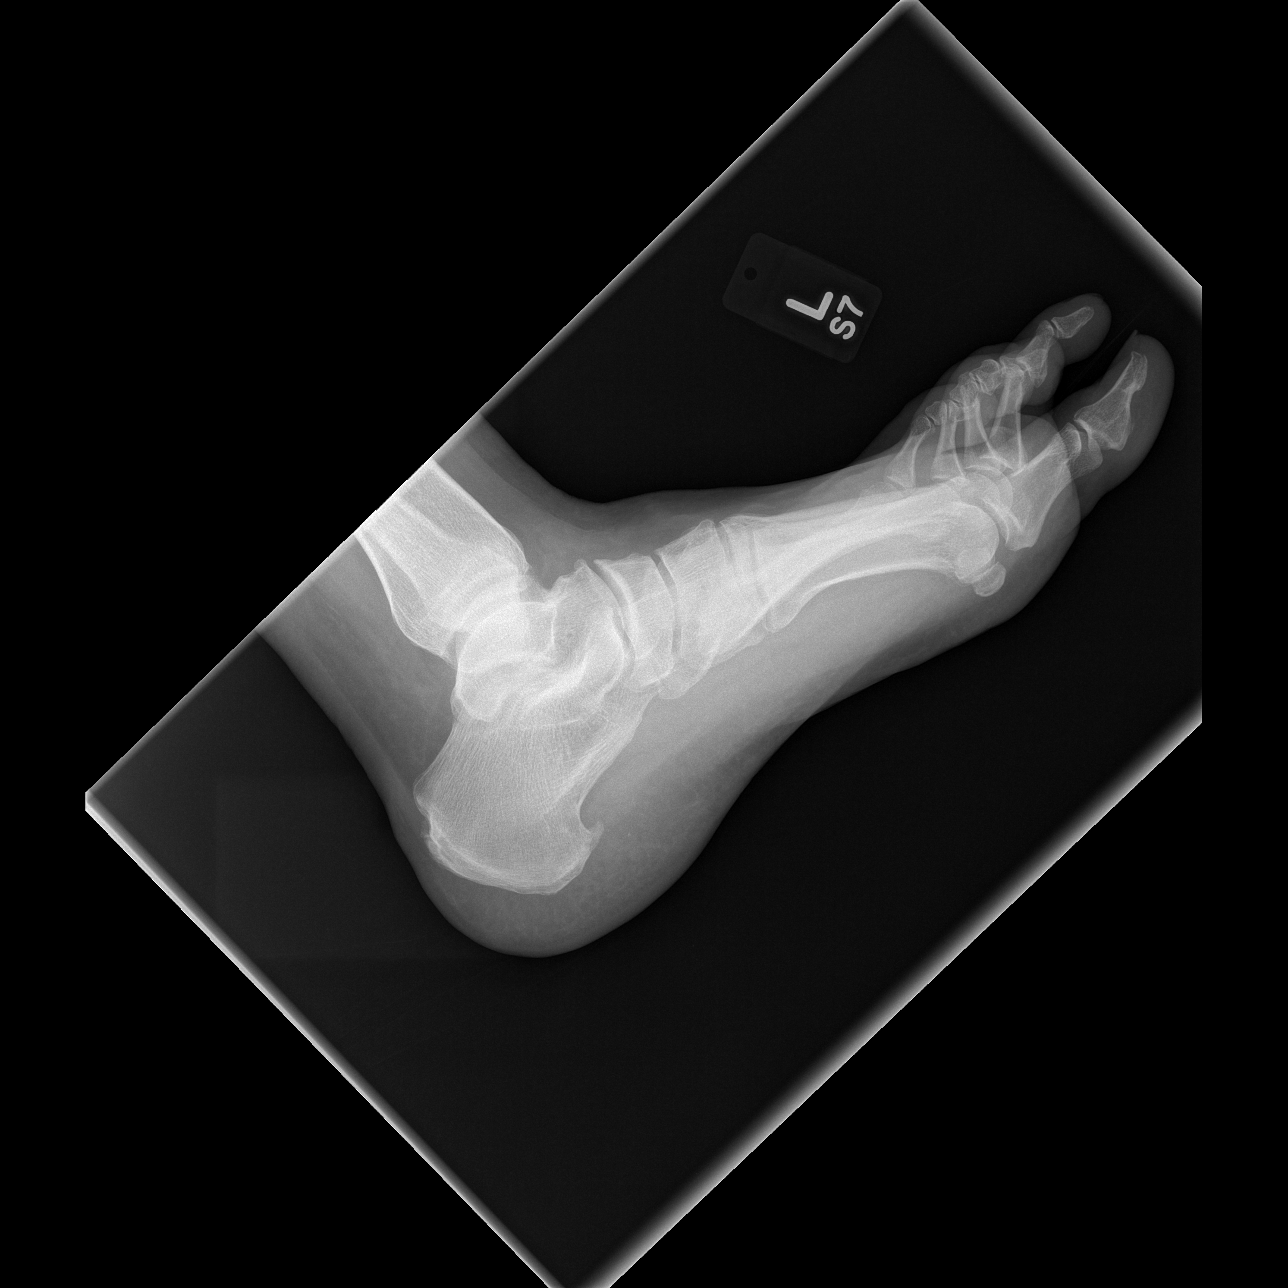

[3 of 3 positions shown; findings below may reference images not displayed]

FINDINGS: Negative for fracture. No significant arthropathy. Mild calcaneal
spurring. No erosive changes.
IMPRESSION: Mild calcaneal spurring.  No fracture.

## 2017-03-26 ENCOUNTER — Encounter: Payer: Self-pay | Admitting: Family Medicine

## 2017-04-20 ENCOUNTER — Ambulatory Visit: Payer: Self-pay | Admitting: Internal Medicine

## 2017-05-22 ENCOUNTER — Ambulatory Visit: Payer: 59 | Admitting: Family Medicine

## 2017-07-14 ENCOUNTER — Ambulatory Visit: Payer: Self-pay

## 2017-07-14 ENCOUNTER — Other Ambulatory Visit: Payer: Self-pay | Admitting: Occupational Medicine

## 2017-07-14 DIAGNOSIS — Z0000 Encounter for general adult medical examination without abnormal findings: Secondary | ICD-10-CM

## 2017-07-16 ENCOUNTER — Encounter: Payer: Self-pay | Admitting: Family Medicine

## 2017-08-02 ENCOUNTER — Encounter (HOSPITAL_COMMUNITY): Payer: Self-pay | Admitting: Emergency Medicine

## 2017-08-02 ENCOUNTER — Emergency Department (HOSPITAL_COMMUNITY)
Admission: EM | Admit: 2017-08-02 | Discharge: 2017-08-02 | Disposition: A | Discharge status: Home / Self Care | Payer: Self-pay | Attending: Emergency Medicine | Admitting: Emergency Medicine

## 2017-08-02 ENCOUNTER — Emergency Department (HOSPITAL_COMMUNITY): Payer: Self-pay

## 2017-08-02 DIAGNOSIS — X500XXA Overexertion from strenuous movement or load, initial encounter: Secondary | ICD-10-CM | POA: Insufficient documentation

## 2017-08-02 DIAGNOSIS — Z79899 Other long term (current) drug therapy: Secondary | ICD-10-CM | POA: Insufficient documentation

## 2017-08-02 DIAGNOSIS — Y929 Unspecified place or not applicable: Secondary | ICD-10-CM | POA: Insufficient documentation

## 2017-08-02 DIAGNOSIS — Y9301 Activity, walking, marching and hiking: Secondary | ICD-10-CM | POA: Insufficient documentation

## 2017-08-02 DIAGNOSIS — S93402A Sprain of unspecified ligament of left ankle, initial encounter: Secondary | ICD-10-CM | POA: Insufficient documentation

## 2017-08-02 DIAGNOSIS — Y999 Unspecified external cause status: Secondary | ICD-10-CM | POA: Insufficient documentation

## 2017-08-02 DIAGNOSIS — F419 Anxiety disorder, unspecified: Secondary | ICD-10-CM | POA: Insufficient documentation

## 2017-08-02 NOTE — Discharge Instructions (Signed)
The x-ray was reassuring.  No fracture or dislocation.  It is important that you ice the ankle at least twice a day for 15 minutes at a time.  You can take ibuprofen every 6 hours as needed for pain.  Please elevate the ankle when you can.  Please call and schedule an appoint with your primary doctor if your pain is not improving in a week.  Return to the emergency department if you develop numbness, weakness or have any new or worsening symptoms.

## 2017-08-02 NOTE — ED Triage Notes (Signed)
Patient here from home with complaints of left ankle pain. Injury while at work x3 days. Reports swelling and pain increase. Ambulatory.

## 2017-08-02 NOTE — ED Provider Notes (Signed)
[facility]-EMERGENCY DEPT Provider Note   CSN: [id number] Arrival date & time: 08/02/17  1232     History   Chief Complaint Chief Complaint  Patient presents with  . Ankle Pain    HPI Paul Eberwein. is a 42 y.o. male.  HPI  Paul Sweeney is a 42yo male with a history of obesity, anxiety and depression who presents emergency department for evaluation of left ankle pain.  Patient states that he rolled his left ankle inwards while going down the stairs 2 days ago.  Denies falling or hitting his head.  States that he has had left ankle swelling and pain ever since.  Pain is primarily located over the lateral malleolus.  Pain is severe, "throbbing" and worsened with inversion of the ankle or with ambulation.  He has tried taking ibuprofen and icing the ankle without significant relief of his pain.  Denies fevers, chills, numbness, weakness, calf swelling, wound or arthralgias elsewhere. He is able to ambulate independently, although painful.   Past Medical History:  Diagnosis Date  . Anxiety and depression    never hospitalized, remote  . Chicken pox   . Obesity   . Plantar fasciitis    calcaneal bone spur    Patient Active Problem List   Diagnosis Date Noted  . Obstructive sleep apnea 12/04/2016  . Morbid obesity due to excess calories (HCC) 12/04/2016  . Anxiety and Depression 11/22/2014  . Hyperlipemia 12/20/2007  . GERD 10/29/2007    Past Surgical History:  Procedure Laterality Date  . TONSILLECTOMY     age 30        Home Medications    Prior to Admission medications   Medication Sig Start Date End Date Taking? Authorizing Provider  Multiple Vitamins-Minerals (MULTIVITAL) tablet Take 1 tablet by mouth daily.    [provider]  PARoxetine (PAXIL) 30 MG tablet Take 1 tablet (30 mg total) by mouth daily. 07/31/16   Terressa Koyanagi, DO    Family History Family History  Problem Relation Age of Onset  . Hypertension Mother   .  Diabetes Mother   . High Cholesterol Mother   . Healthy Father   . Healthy Sister   . Healthy Brother   . Healthy Brother   . Healthy Brother   . Colon cancer Unknown   . Prostate cancer Unknown     Social History Social History   Tobacco Use  . Smoking status: Never Smoker  . Smokeless tobacco: Never Used  Substance Use Topics  . Alcohol use: Yes    Comment: occ, 4 drinks about 1-2 time sper month  . Drug use: No     Allergies   Patient has no allergy information on record.   Review of Systems Review of Systems  Constitutional: Negative for chills and fever.  Musculoskeletal: Positive for arthralgias (left ankle) and joint swelling. Negative for gait problem.  Skin: Negative for color change and wound.  Neurological: Negative for weakness and numbness.     Physical Exam Updated Vital Signs BP 117/64 (BP Location: Right Arm)   Pulse 76   Temp 97.9 F (36.6 C) (Oral)   Resp 20   SpO2 97%   Physical Exam  Constitutional: He is oriented to person, place, and time. He appears well-developed and well-nourished. No distress.  HENT:  Head: Normocephalic and atraumatic.  Eyes: Right eye exhibits no discharge. Left eye exhibits no discharge.  Pulmonary/Chest: Effort normal. No respiratory distress.  Musculoskeletal:  Left  ankle with tenderness to palpation over the lateral malleolus. Swelling noted over medial and lateral malleolus. Full active ROM. No erythema, ecchymosis, or deformity appreciated. No break in skin. No pain to fifth metatarsal area or navicular region. Achilles intact. No calf swelling or tenderness. DP pulses 2+ and cap refill of toes.   Neurological: He is alert and oriented to person, place, and time. Coordination normal.  Sensation to light/sharp touch intact in bilateral LE. Gait normal in coordination and balance.   Skin: Skin is warm and dry. Capillary refill takes less than 2 seconds. He is not diaphoretic.  Psychiatric: He has a normal mood  and affect. His behavior is normal.  Nursing note and vitals reviewed.    ED Treatments / Results  Labs (all labs ordered are listed, but only abnormal results are displayed) Labs Reviewed - No data to display  EKG  EKG Interpretation None       Radiology Dg Ankle Complete Left  Result Date: 08/02/2017 CLINICAL DATA:  Missed a step at work 3 days ago rolling LEFT ankle, lateral pain EXAM: LEFT ANKLE COMPLETE - 3+ VIEW COMPARISON:  None FINDINGS: Diffuse soft tissue swelling at LEFT ankle. Osseous mineralization normal. Joint spaces preserved. Corticated ossicle at tip of lateral malleolus, appears old. Small plantar calcaneal spur. No acute fracture, dislocation, or bone destruction. IMPRESSION: No acute osseous abnormalities. Electronically Signed   By: Ulyses SouthwardMark  Boles M.D.   On: 08/02/2017 13:55    Procedures Procedures (including critical care time)  Medications Ordered in ED Medications - No data to display   Initial Impression / Assessment and Plan / ED Course  I have reviewed the triage vital signs and the nursing notes.  Pertinent labs & imaging results that were available during my care of the patient were reviewed by me and considered in my medical decision making (see chart for details).    Xray of the ankle without acute fracture or abnormality. Foot neurovascularly intact on exam. Exam consistent with left lateral ankle sprain. Have counseled patient on NSAID use and RICE protocol. Placed in an ASO ankle brace for comfort. He declines crutches. Discussed follow up with PCP if pain is not improving in a week. Patient agrees and voices understanding to the above plan and has no complaints prior to discharge.   Final Clinical Impressions(s) / ED Diagnoses   Final diagnoses:  Sprain of left ankle, unspecified ligament, initial encounter    ED Discharge Orders    None       Lawrence MarseillesShrosbree, Emily J, PA-C 08/02/17 1526    Mancel BaleWentz, Elliott, MD 08/02/17 2125

## 2020-03-01 ENCOUNTER — Encounter: Payer: Self-pay | Admitting: Family Medicine

## 2020-03-01 ENCOUNTER — Other Ambulatory Visit: Payer: Self-pay

## 2020-03-01 ENCOUNTER — Ambulatory Visit (INDEPENDENT_AMBULATORY_CARE_PROVIDER_SITE_OTHER): Payer: BC Managed Care – PPO | Admitting: Family Medicine

## 2020-03-01 VITALS — BP 126/68 | HR 77 | Temp 97.9°F | Ht 71.0 in | Wt 366.0 lb

## 2020-03-01 DIAGNOSIS — E1165 Type 2 diabetes mellitus with hyperglycemia: Secondary | ICD-10-CM

## 2020-03-01 DIAGNOSIS — F39 Unspecified mood [affective] disorder: Secondary | ICD-10-CM

## 2020-03-01 DIAGNOSIS — K219 Gastro-esophageal reflux disease without esophagitis: Secondary | ICD-10-CM | POA: Diagnosis not present

## 2020-03-01 DIAGNOSIS — Z0000 Encounter for general adult medical examination without abnormal findings: Secondary | ICD-10-CM | POA: Diagnosis not present

## 2020-03-01 DIAGNOSIS — Z13220 Encounter for screening for lipoid disorders: Secondary | ICD-10-CM | POA: Insufficient documentation

## 2020-03-01 MED ORDER — FLUOXETINE HCL 10 MG PO CAPS: ORAL_CAPSULE | ORAL | 1 refills | Status: DC

## 2020-03-01 MED ORDER — LANSOPRAZOLE 30 MG PO CPDR: DELAYED_RELEASE_CAPSULE | ORAL | 1 refills | Status: DC

## 2020-03-01 NOTE — Progress Notes (Addendum)
Established Patient Office Visit  Subjective:  Patient ID: Paul PietyKenneth R Mondor Jr., male    DOB: 24-Jan-1976  Age: 44 y.o. MRN: [id number]  CC:  Chief Complaint  Patient presents with  . Establish Care    New patient CPE, would like to be checked for DM    HPI Paul PietyKenneth R Flythe Jr. presents for establishment of care a physical exam and follow-up of some medical issues and concerns.  Lost to follow-up secondary to loss of insurance.  Past medical history of obesity and anxiety with depression.  Letter to been treated in the past with Paxil.  He did okay with the Paxil but never really felt as good as he thought he might.  Mom is a diabetic.  His fasting sugars have been elevated.  He has been dieting exercising and been able to lose about 70 pounds.  He has tried mild Prevacid for reflux and it is helped a great deal.  Would like his own prescription.  He lives with his parents and his 3 children.  Past Medical History:  Diagnosis Date  . Anxiety and depression    never hospitalized, remote  . Chicken pox   . Obesity   . Plantar fasciitis    calcaneal bone spur    Past Surgical History:  Procedure Laterality Date  . TONSILLECTOMY     age 89     Family History  Problem Relation Age of Onset  . Hypertension Mother   . Diabetes Mother   . High Cholesterol Mother   . Healthy Father   . Healthy Sister   . Healthy Brother   . Healthy Brother   . Healthy Brother   . Colon cancer Other   . Prostate cancer Other     Social History   Socioeconomic History  . Marital status: Single    Spouse name: Not on file  . Number of children: Not on file  . Years of education: Not on file  . Highest education level: Not on file  Occupational History  . Not on file  Tobacco Use  . Smoking status: Never Smoker  . Smokeless tobacco: Never Used  Vaping Use  . Vaping Use: Never used  Substance and Sexual Activity  . Alcohol use: Yes    Alcohol/week: 1.0 standard drink    Types: 1 Cans  of beer per week    Comment: social  . Drug use: No  . Sexual activity: Not Currently  Other Topics Concern  . Not on file  Social History Narrative   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      Home Situation: lives with kids (12, 8 and 6 in 2014)      Spiritual Beliefs:      Lifestyle: exercises daily - gym 5 days, CV, swimming, weights; diet is improving - son has glut-1 and on adkins type diet, pt has lost 30 lbs in last few months            Social Determinants of Health   Financial Resource Strain:   . Difficulty of Paying Living Expenses: Not on file  Food Insecurity:   . Worried About Programme researcher, broadcasting/film/videounning Out of Food in the Last Year: Not on file  . Ran Out of Food in the Last Year: Not on file  Transportation Needs:   . Lack of Transportation (Medical): Not on file  . Lack of Transportation (Non-Medical): Not on file  Physical Activity:   . Days of Exercise  per Week: Not on file  . Minutes of Exercise per Session: Not on file  Stress:   . Feeling of Stress : Not on file  Social Connections:   . Frequency of Communication with Friends and Family: Not on file  . Frequency of Social Gatherings with Friends and Family: Not on file  . Attends Religious Services: Not on file  . Active Member of Clubs or Organizations: Not on file  . Attends Banker Meetings: Not on file  . Marital Status: Not on file  Intimate Partner Violence:   . Fear of Current or Ex-Partner: Not on file  . Emotionally Abused: Not on file  . Physically Abused: Not on file  . Sexually Abused: Not on file    Outpatient Medications Prior to Visit  Medication Sig Dispense Refill  . Multiple Vitamins-Minerals (MULTIVITAL) tablet Take 1 tablet by mouth daily. (Patient not taking: Reported on 03/01/2020)    . PARoxetine (PAXIL) 30 MG tablet Take 1 tablet (30 mg total) by mouth daily. 30 tablet 3   No facility-administered medications prior to visit.    Not on File  ROS Review of  Systems  Constitutional: Negative.   HENT: Negative.   Eyes: Negative for photophobia and visual disturbance.  Respiratory: Negative.   Cardiovascular: Negative.   Gastrointestinal: Negative.   Endocrine: Negative for polyphagia and polyuria.  Genitourinary: Negative.   Musculoskeletal: Negative for gait problem and joint swelling.  Skin: Negative for pallor and rash.  Allergic/Immunologic: Negative for immunocompromised state.  Neurological: Negative for speech difficulty and light-headedness.  Hematological: Does not bruise/bleed easily.      Objective:    Physical Exam Vitals and nursing note reviewed.  Constitutional:      General: He is not in acute distress.    Appearance: Normal appearance. He is obese. He is not ill-appearing, toxic-appearing or diaphoretic.  HENT:     Head: Normocephalic and atraumatic.     Right Ear: Tympanic membrane, ear canal and external ear normal.     Left Ear: Tympanic membrane, ear canal and external ear normal.     Mouth/Throat:     Mouth: Mucous membranes are moist.     Pharynx: Oropharynx is clear. No oropharyngeal exudate or posterior oropharyngeal erythema.  Eyes:     General: No scleral icterus.       Right eye: No discharge.        Left eye: No discharge.     Extraocular Movements: Extraocular movements intact.     Conjunctiva/sclera: Conjunctivae normal.     Pupils: Pupils are equal, round, and reactive to light.  Cardiovascular:     Rate and Rhythm: Normal rate and regular rhythm.  Pulmonary:     Effort: Pulmonary effort is normal.     Breath sounds: Normal breath sounds.  Abdominal:     General: Abdomen is flat. Bowel sounds are normal. There is no distension.     Palpations: Abdomen is soft. There is no mass.     Tenderness: There is no abdominal tenderness. There is no guarding or rebound.     Hernia: No hernia is present. There is no hernia in the left inguinal area or right inguinal area.  Genitourinary:    Penis: No  tenderness, swelling or lesions.      Testes:        Right: Mass, tenderness or swelling not present. Right testis is descended.        Left: Mass, tenderness or swelling not present.  Left testis is descended.     Epididymis:     Right: Not inflamed or enlarged.     Left: Not inflamed or enlarged.  Musculoskeletal:     Cervical back: No rigidity or tenderness.  Lymphadenopathy:     Cervical: No cervical adenopathy.     Lower Body: No right inguinal adenopathy. No left inguinal adenopathy.  Skin:    General: Skin is warm and dry.  Neurological:     Mental Status: He is alert.  Psychiatric:        Mood and Affect: Mood normal.        Behavior: Behavior normal.     BP 126/68   Pulse 77   Temp 97.9 F (36.6 C) (Tympanic)   Ht 5\' 11"  (1.803 m)   Wt (!) 366 lb (166 kg)   SpO2 97%   BMI 51.05 kg/m  Wt Readings from Last 3 Encounters:  03/01/20 (!) 366 lb (166 kg)  12/04/16 (!) 370 lb 6.4 oz (168 kg)  11/21/16 (!) 377 lb 6.4 oz (171.2 kg)     Health Maintenance Due  Topic Date Due  . Hepatitis C Screening  Never done  . PNEUMOCOCCAL POLYSACCHARIDE VACCINE AGE 72-64 HIGH RISK  Never done  . FOOT EXAM  Never done  . OPHTHALMOLOGY EXAM  Never done  . INFLUENZA VACCINE  02/05/2020    There are no preventive care reminders to display for this patient.  Lab Results  Component Value Date   TSH 2.17 03/02/2020   Lab Results  Component Value Date   WBC 10.1 03/02/2020   HGB 14.5 03/02/2020   HCT 43.7 03/02/2020   MCV 93.4 03/02/2020   PLT 267.0 03/02/2020   Lab Results  Component Value Date   NA 136 03/02/2020   K 4.2 03/02/2020   CO2 25 03/02/2020   GLUCOSE 149 (H) 03/02/2020   BUN 13 03/02/2020   CREATININE 0.83 03/02/2020   BILITOT 1.1 03/02/2020   ALKPHOS 82 03/02/2020   AST 32 03/02/2020   ALT 20 03/02/2020   PROT 6.6 03/02/2020   ALBUMIN 3.9 03/02/2020   CALCIUM 9.0 03/02/2020   GFR 99.78 03/02/2020   Lab Results  Component Value Date   CHOL 184  03/02/2020   Lab Results  Component Value Date   HDL 35.20 (L) 03/02/2020   Lab Results  Component Value Date   LDLCALC 119 (H) 03/02/2020   Lab Results  Component Value Date   TRIG 147.0 03/02/2020   Lab Results  Component Value Date   CHOLHDL 5 03/02/2020   Lab Results  Component Value Date   HGBA1C 7.1 (H) 03/02/2020      Assessment & Plan:   Problem List Items Addressed This Visit      Digestive   Gastroesophageal reflux disease   Relevant Medications   lansoprazole (PREVACID) 30 MG capsule     Endocrine   Type 2 diabetes mellitus with hyperglycemia, without long-term current use of insulin (HCC)   Relevant Medications   metFORMIN (GLUCOPHAGE XR) 500 MG 24 hr tablet     Other   Anxiety and Depression   Relevant Medications   FLUoxetine (PROZAC) 10 MG capsule   Morbid obesity due to excess calories (HCC)   Relevant Medications   metFORMIN (GLUCOPHAGE XR) 500 MG 24 hr tablet   Healthcare maintenance - Primary   Relevant Orders   CBC (Completed)   Comprehensive metabolic panel (Completed)   Hemoglobin A1c (Completed)   Lipid panel (Completed)  TSH (Completed)   Urinalysis, Routine w reflex microscopic (Completed)      Meds ordered this encounter  Medications  . FLUoxetine (PROZAC) 10 MG capsule    Sig: Take one each morning for one week and then take 2 each morning.    Dispense:  60 capsule    Refill:  1  . lansoprazole (PREVACID) 30 MG capsule    Sig: Take one daily.    Dispense:  90 capsule    Refill:  1  . metFORMIN (GLUCOPHAGE XR) 500 MG 24 hr tablet    Sig: Take 1 tablet (500 mg total) by mouth daily with breakfast.    Dispense:  90 tablet    Refill:  1    Follow-up: Return in about 1 month (around 04/01/2020), or return fasting for blood work..  Encouraged him to continue his healthy habits with diet and exercise.  Continue his weight loss efforts.  Agrees to give Prozac a try.  We will start Prevacid.  Treatment for diabetes pending lab  results.  Given information on health maintenance disease prevention and GERD.  Mliss Sax, MD

## 2020-03-01 NOTE — Patient Instructions (Addendum)
Gastroesophageal Reflux Disease, Adult Gastroesophageal reflux (GER) happens when acid from the stomach flows up into the tube that connects the mouth and the stomach (esophagus). Normally, food travels down the esophagus and stays in the stomach to be digested. However, when a person has GER, food and stomach acid sometimes move back up into the esophagus. If this becomes a more serious problem, the person may be diagnosed with a disease called gastroesophageal reflux disease (GERD). GERD occurs when the reflux:  Happens often.  Causes frequent or severe symptoms.  Causes problems such as damage to the esophagus. When stomach acid comes in contact with the esophagus, the acid may cause soreness (inflammation) in the esophagus. Over time, GERD may create small holes (ulcers) in the lining of the esophagus. What are the causes? This condition is caused by a problem with the muscle between the esophagus and the stomach (lower esophageal sphincter, or LES). Normally, the LES muscle closes after food passes through the esophagus to the stomach. When the LES is weakened or abnormal, it does not close properly, and that allows food and stomach acid to go back up into the esophagus. The LES can be weakened by certain dietary substances, medicines, and medical conditions, including:  Tobacco use.  Pregnancy.  Having a hiatal hernia.  Alcohol use.  Certain foods and beverages, such as coffee, chocolate, onions, and peppermint. What increases the risk? You are more likely to develop this condition if you:  Have an increased body weight.  Have a connective tissue disorder.  Use NSAID medicines. What are the signs or symptoms? Symptoms of this condition include:  Heartburn.  Difficult or painful swallowing.  The feeling of having a lump in the throat.  Abitter taste in the mouth.  Bad breath.  Having a large amount of saliva.  Having an upset or bloated  stomach.  Belching.  Chest pain. Different conditions can cause chest pain. Make sure you see your health care provider if you experience chest pain.  Shortness of breath or wheezing.  Ongoing (chronic) cough or a night-time cough.  Wearing away of tooth enamel.  Weight loss. How is this diagnosed? Your health care provider will take a medical history and perform a physical exam. To determine if you have mild or severe GERD, your health care provider may also monitor how you respond to treatment. You may also have tests, including:  A test to examine your stomach and esophagus with a small camera (endoscopy).  A test thatmeasures the acidity level in your esophagus.  A test thatmeasures how much pressure is on your esophagus.  A barium swallow or modified barium swallow test to show the shape, size, and functioning of your esophagus. How is this treated? The goal of treatment is to help relieve your symptoms and to prevent complications. Treatment for this condition may vary depending on how severe your symptoms are. Your health care provider may recommend:  Changes to your diet.  Medicine.  Surgery. Follow these instructions at home: Eating and drinking   Follow a diet as recommended by your health care provider. This may involve avoiding foods and drinks such as: ? Coffee and tea (with or without caffeine). ? Drinks that containalcohol. ? Energy drinks and sports drinks. ? Carbonated drinks or sodas. ? Chocolate and cocoa. ? Peppermint and mint flavorings. ? Garlic and onions. ? Horseradish. ? Spicy and acidic foods, including peppers, chili powder, curry powder, vinegar, hot sauces, and barbecue sauce. ? Citrus fruit juices and citrus   fruits, such as oranges, lemons, and limes. ? Tomato-based foods, such as red sauce, chili, salsa, and pizza with red sauce. ? Fried and fatty foods, such as donuts, french fries, potato chips, and high-fat dressings. ? High-fat  meats, such as hot dogs and fatty cuts of red and white meats, such as rib eye steak, sausage, ham, and bacon. ? High-fat dairy items, such as whole milk, butter, and cream cheese.  Eat small, frequent meals instead of large meals.  Avoid drinking large amounts of liquid with your meals.  Avoid eating meals during the 2-3 hours before bedtime.  Avoid lying down right after you eat.  Do not exercise right after you eat. Lifestyle   Do not use any products that contain nicotine or tobacco, such as cigarettes, e-cigarettes, and chewing tobacco. If you need help quitting, ask your health care provider.  Try to reduce your stress by using methods such as yoga or meditation. If you need help reducing stress, ask your health care provider.  If you are overweight, reduce your weight to an amount that is healthy for you. Ask your health care provider for guidance about a safe weight loss goal. General instructions  Pay attention to any changes in your symptoms.  Take over-the-counter and prescription medicines only as told by your health care provider. Do not take aspirin, ibuprofen, or other NSAIDs unless your health care provider told you to do so.  Wear loose-fitting clothing. Do not wear anything tight around your waist that causes pressure on your abdomen.  Raise (elevate) the head of your bed about 6 inches (15 cm).  Avoid bending over if this makes your symptoms worse.  Keep all follow-up visits as told by your health care provider. This is important. Contact a health care provider if:  You have: ? New symptoms. ? Unexplained weight loss. ? Difficulty swallowing or it hurts to swallow. ? Wheezing or a persistent cough. ? A hoarse voice.  Your symptoms do not improve with treatment. Get help right away if you:  Have pain in your arms, neck, jaw, teeth, or back.  Feel sweaty, dizzy, or light-headed.  Have chest pain or shortness of breath.  Vomit and your vomit looks  like blood or coffee grounds.  Faint.  Have stool that is bloody or black.  Cannot swallow, drink, or eat. Summary  Gastroesophageal reflux happens when acid from the stomach flows up into the esophagus. GERD is a disease in which the reflux happens often, causes frequent or severe symptoms, or causes problems such as damage to the esophagus.  Treatment for this condition may vary depending on how severe your symptoms are. Your health care provider may recommend diet and lifestyle changes, medicine, or surgery.  Contact a health care provider if you have new or worsening symptoms.  Take over-the-counter and prescription medicines only as told by your health care provider. Do not take aspirin, ibuprofen, or other NSAIDs unless your health care provider told you to do so.  Keep all follow-up visits as told by your health care provider. This is important. This information is not intended to replace advice given to you by your health care provider. Make sure you discuss any questions you have with your health care provider. Document Revised: 12/30/2017 Document Reviewed: 12/30/2017 Elsevier Patient Education  2020 [place] Maintenance, Male Adopting a healthy lifestyle and getting preventive care are important in promoting health and wellness. Ask your health care provider about:  The right schedule for  you to have regular tests and exams.  Things you can do on your own to prevent diseases and keep yourself healthy. What should I know about diet, weight, and exercise? Eat a healthy diet   Eat a diet that includes plenty of vegetables, fruits, low-fat dairy products, and lean protein.  Do not eat a lot of foods that are high in solid fats, added sugars, or sodium. Maintain a healthy weight Body mass index (BMI) is a measurement that can be used to identify possible weight problems. It estimates body fat based on height and weight. Your health care provider can help  determine your BMI and help you achieve or maintain a healthy weight. Get regular exercise Get regular exercise. This is one of the most important things you can do for your health. Most adults should:  Exercise for at least 150 minutes each week. The exercise should increase your heart rate and make you sweat (moderate-intensity exercise).  Do strengthening exercises at least twice a week. This is in addition to the moderate-intensity exercise.  Spend less time sitting. Even light physical activity can be beneficial. Watch cholesterol and blood lipids Have your blood tested for lipids and cholesterol at 46 years of age, then have this test every 5 years. You may need to have your cholesterol levels checked more often if:  Your lipid or cholesterol levels are high.  You are older than 46 years of age.  You are at high risk for heart disease. What should I know about cancer screening? Many types of cancers can be detected early and may often be prevented. Depending on your health history and family history, you may need to have cancer screening at various ages. This may include screening for:  Colorectal cancer.  Prostate cancer.  Skin cancer.  Lung cancer. What should I know about heart disease, diabetes, and high blood pressure? Blood pressure and heart disease  High blood pressure causes heart disease and increases the risk of stroke. This is more likely to develop in people who have high blood pressure readings, are of African descent, or are overweight.  Talk with your health care provider about your target blood pressure readings.  Have your blood pressure checked: ? Every 3-5 years if you are 24-15 years of age. ? Every year if you are 62 years old or older.  If you are between the ages of 33 and 86 and are a current or former smoker, ask your health care provider if you should have a one-time screening for abdominal aortic aneurysm (AAA). Diabetes Have regular diabetes  screenings. This checks your fasting blood sugar level. Have the screening done:  Once every three years after age 9 if you are at a normal weight and have a low risk for diabetes.  More often and at a younger age if you are overweight or have a high risk for diabetes. What should I know about preventing infection? Hepatitis B If you have a higher risk for hepatitis B, you should be screened for this virus. Talk with your health care provider to find out if you are at risk for hepatitis B infection. Hepatitis C Blood testing is recommended for:  Everyone born from [birth year] through 1967.  Anyone with known risk factors for hepatitis C. Sexually transmitted infections (STIs)  You should be screened each year for STIs, including gonorrhea and chlamydia, if: ? You are sexually active and are younger than 46 years of age. ? You are older than 46 years of  age and your health care provider tells you that you are at risk for this type of infection. ? Your sexual activity has changed since you were last screened, and you are at increased risk for chlamydia or gonorrhea. Ask your health care provider if you are at risk.  Ask your health care provider about whether you are at high risk for HIV. Your health care provider may recommend a prescription medicine to help prevent HIV infection. If you choose to take medicine to prevent HIV, you should first get tested for HIV. You should then be tested every 3 months for as long as you are taking the medicine. Follow these instructions at home: Lifestyle  Do not use any products that contain nicotine or tobacco, such as cigarettes, e-cigarettes, and chewing tobacco. If you need help quitting, ask your health care provider.  Do not use street drugs.  Do not share needles.  Ask your health care provider for help if you need support or information about quitting drugs. Alcohol use  Do not drink alcohol if your health care provider tells you not to  drink.  If you drink alcohol: ? Limit how much you have to 0-2 drinks a day. ? Be aware of how much alcohol is in your drink. In the [place] one drink equals one 12 oz bottle of beer (355 mL), one 5 oz glass of wine (148 mL), or one 1 oz glass of hard liquor (44 mL). General instructions  Schedule regular health, dental, and eye exams.  Stay current with your vaccines.  Tell your health care provider if: ? You often feel depressed. ? You have ever been abused or do not feel safe at home. Summary  Adopting a healthy lifestyle and getting preventive care are important in promoting health and wellness.  Follow your health care provider's instructions about healthy diet, exercising, and getting tested or screened for diseases.  Follow your health care provider's instructions on monitoring your cholesterol and blood pressure. This information is not intended to replace advice given to you by your health care provider. Make sure you discuss any questions you have with your health care provider. Document Revised: 06/16/2018 Document Reviewed: 06/16/2018 Elsevier Patient Education  2020 [employer]  Preventive Care 50-23 Years Old, Male Preventive care refers to lifestyle choices and visits with your health care provider that can promote health and wellness. This includes:  A yearly physical exam. This is also called an annual well check.  Regular dental and eye exams.  Immunizations.  Screening for certain conditions.  Healthy lifestyle choices, such as eating a healthy diet, getting regular exercise, not using drugs or products that contain nicotine and tobacco, and limiting alcohol use. What can I expect for my preventive care visit? Physical exam Your health care provider will check:  Height and weight. These may be used to calculate body mass index (BMI), which is a measurement that tells if you are at a healthy weight.  Heart rate and blood pressure.  Your skin for  abnormal spots. Counseling Your health care provider may ask you questions about:  Alcohol, tobacco, and drug use.  Emotional well-being.  Home and relationship well-being.  Sexual activity.  Eating habits.  Work and work Statistician. What immunizations do I need?  Influenza (flu) vaccine  This is recommended every year. Tetanus, diphtheria, and pertussis (Tdap) vaccine  You may need a Td booster every 10 years. Varicella (chickenpox) vaccine  You may need this vaccine if you have not already  been vaccinated. Zoster (shingles) vaccine  You may need this after age 11. Measles, mumps, and rubella (MMR) vaccine  You may need at least one dose of MMR if you were born in 1957 or later. You may also need a second dose. Pneumococcal conjugate (PCV13) vaccine  You may need this if you have certain conditions and were not previously vaccinated. Pneumococcal polysaccharide (PPSV23) vaccine  You may need one or two doses if you smoke cigarettes or if you have certain conditions. Meningococcal conjugate (MenACWY) vaccine  You may need this if you have certain conditions. Hepatitis A vaccine  You may need this if you have certain conditions or if you travel or work in places where you may be exposed to hepatitis A. Hepatitis B vaccine  You may need this if you have certain conditions or if you travel or work in places where you may be exposed to hepatitis B. Haemophilus influenzae type b (Hib) vaccine  You may need this if you have certain risk factors. Human papillomavirus (HPV) vaccine  If recommended by your health care provider, you may need three doses over 6 months. You may receive vaccines as individual doses or as more than one vaccine together in one shot (combination vaccines). Talk with your health care provider about the risks and benefits of combination vaccines. What tests do I need? Blood tests  Lipid and cholesterol levels. These may be checked every 5  years, or more frequently if you are over 18 years old.  Hepatitis C test.  Hepatitis B test. Screening  Lung cancer screening. You may have this screening every year starting at age 46 if you have a 30-pack-year history of smoking and currently smoke or have quit within the past 15 years.  Prostate cancer screening. Recommendations will vary depending on your family history and other risks.  Colorectal cancer screening. All adults should have this screening starting at age 10 and continuing until age 63. Your health care provider may recommend screening at age 62 if you are at increased risk. You will have tests every 1-10 years, depending on your results and the type of screening test.  Diabetes screening. This is done by checking your blood sugar (glucose) after you have not eaten for a while (fasting). You may have this done every 1-3 years.  Sexually transmitted disease (STD) testing. Follow these instructions at home: Eating and drinking  Eat a diet that includes fresh fruits and vegetables, whole grains, lean protein, and low-fat dairy products.  Take vitamin and mineral supplements as recommended by your health care provider.  Do not drink alcohol if your health care provider tells you not to drink.  If you drink alcohol: ? Limit how much you have to 0-2 drinks a day. ? Be aware of how much alcohol is in your drink. In the [place] one drink equals one 12 oz bottle of beer (355 mL), one 5 oz glass of wine (148 mL), or one 1 oz glass of hard liquor (44 mL). Lifestyle  Take daily care of your teeth and gums.  Stay active. Exercise for at least 30 minutes on 5 or more days each week.  Do not use any products that contain nicotine or tobacco, such as cigarettes, e-cigarettes, and chewing tobacco. If you need help quitting, ask your health care provider.  If you are sexually active, practice safe sex. Use a condom or other form of protection to prevent STIs (sexually transmitted  infections).  Talk with your health care provider  about taking a low-dose aspirin every day starting at age 26. What's next?  Go to your health care provider once a year for a well check visit.  Ask your health care provider how often you should have your eyes and teeth checked.  Stay up to date on all vaccines. This information is not intended to replace advice given to you by your health care provider. Make sure you discuss any questions you have with your health care provider. Document Revised: 06/17/2018 Document Reviewed: 06/17/2018 Elsevier Patient Education  2020 [employer].

## 2020-03-02 ENCOUNTER — Other Ambulatory Visit (INDEPENDENT_AMBULATORY_CARE_PROVIDER_SITE_OTHER): Payer: BC Managed Care – PPO

## 2020-03-02 ENCOUNTER — Encounter: Payer: Self-pay | Admitting: Family Medicine

## 2020-03-02 DIAGNOSIS — E1165 Type 2 diabetes mellitus with hyperglycemia: Secondary | ICD-10-CM | POA: Diagnosis not present

## 2020-03-02 DIAGNOSIS — Z0000 Encounter for general adult medical examination without abnormal findings: Secondary | ICD-10-CM

## 2020-03-02 LAB — COMPREHENSIVE METABOLIC PANEL
ALT: 20 U/L (ref 0–53)
AST: 32 U/L (ref 0–37)
Albumin: 3.9 g/dL (ref 3.5–5.2)
Alkaline Phosphatase: 82 U/L (ref 39–117)
BUN: 13 mg/dL (ref 6–23)
CO2: 25 mEq/L (ref 19–32)
Calcium: 9 mg/dL (ref 8.4–10.5)
Chloride: 103 mEq/L (ref 96–112)
Creatinine, Ser: 0.83 mg/dL (ref 0.40–1.50)
GFR: 99.78 mL/min (ref >60.00)
Glucose, Bld: 149 mg/dL — ABNORMAL HIGH (ref 70–99)
Potassium: 4.2 mEq/L (ref 3.5–5.1)
Sodium: 136 mEq/L (ref 135–145)
Total Bilirubin: 1.1 mg/dL (ref 0.2–1.2)
Total Protein: 6.6 g/dL (ref 6.0–8.3)

## 2020-03-02 LAB — URINALYSIS, ROUTINE W REFLEX MICROSCOPIC
Bilirubin Urine: NEGATIVE
Hgb urine dipstick: NEGATIVE
Ketones, ur: NEGATIVE
Leukocytes,Ua: NEGATIVE
Nitrite: NEGATIVE
Specific Gravity, Urine: 1.025 (ref 1.000–1.030)
Total Protein, Urine: NEGATIVE
Urine Glucose: NEGATIVE
Urobilinogen, UA: 0.2 (ref 0.0–1.0)
pH: 6 (ref 5.0–8.0)

## 2020-03-02 LAB — LIPID PANEL
Cholesterol: 184 mg/dL (ref 0–200)
HDL: 35.2 mg/dL — ABNORMAL LOW (ref >39.00)
LDL Cholesterol: 119 mg/dL — ABNORMAL HIGH (ref 0–99)
NonHDL: 148.58
Total CHOL/HDL Ratio: 5
Triglycerides: 147 mg/dL (ref 0.0–149.0)
VLDL: 29.4 mg/dL (ref 0.0–40.0)

## 2020-03-02 LAB — CBC
HCT: 43.7 % (ref 39.0–52.0)
Hemoglobin: 14.5 g/dL (ref 13.0–17.0)
MCHC: 33.1 g/dL (ref 30.0–36.0)
MCV: 93.4 fl (ref 78.0–100.0)
Platelets: 267 10*3/uL (ref 150.0–400.0)
RBC: 4.68 Mil/uL (ref 4.22–5.81)
RDW: 12.6 % (ref 11.5–15.5)
WBC: 10.1 10*3/uL (ref 4.0–10.5)

## 2020-03-02 LAB — TSH: TSH: 2.17 u[IU]/mL (ref 0.35–4.50)

## 2020-03-02 LAB — HEMOGLOBIN A1C: Hgb A1c MFr Bld: 7.1 % — ABNORMAL HIGH (ref 4.6–6.5)

## 2020-03-02 MED ORDER — METFORMIN HCL ER 500 MG PO TB24: 500.0000 mg | ORAL_TABLET | Freq: Every day | ORAL | 1 refills | Status: DC

## 2020-03-02 NOTE — Addendum Note (Signed)
Addended by: Andrez Grime on: 03/02/2020 03:23 PM   Modules accepted: Orders

## 2020-03-05 DIAGNOSIS — Z23 Encounter for immunization: Secondary | ICD-10-CM | POA: Diagnosis not present

## 2020-03-13 ENCOUNTER — Telehealth (INDEPENDENT_AMBULATORY_CARE_PROVIDER_SITE_OTHER): Payer: BC Managed Care – PPO | Admitting: Family Medicine

## 2020-03-13 ENCOUNTER — Encounter: Payer: Self-pay | Admitting: Family Medicine

## 2020-03-13 VITALS — Temp 97.0°F | Wt 365.0 lb

## 2020-03-13 DIAGNOSIS — U071 COVID-19: Secondary | ICD-10-CM | POA: Insufficient documentation

## 2020-03-13 NOTE — Progress Notes (Signed)
Established Patient Office Visit  Subjective:  Patient ID: Paul Proch., male    DOB: 16-Nov-1975  Age: 44 y.o. MRN: [id number]  CC:  Chief Complaint  Patient presents with  . Acute Visit    cough, congestion, loss of taste/smell x 4 days, no SOB or Exposure.     HPI Paul Fuston. presents for evaluation of a 4-day history of nasal congestion rhinorrhea postnasal drip cough and loss of taste and smell.  Denies fever chills, shortness of breath wheezing or difficulty breathing.  There has been no diarrhea.  Finished his [employer] Covid vaccine series back in February.  He does not smoke.  Covid test planned for tomorrow.  Past Medical History:  Diagnosis Date  . Anxiety and depression    never hospitalized, remote  . Chicken pox   . Obesity   . Plantar fasciitis    calcaneal bone spur    Past Surgical History:  Procedure Laterality Date  . TONSILLECTOMY     age 25     Family History  Problem Relation Age of Onset  . Hypertension Mother   . Diabetes Mother   . High Cholesterol Mother   . Healthy Father   . Healthy Sister   . Healthy Brother   . Healthy Brother   . Healthy Brother   . Colon cancer Other   . Prostate cancer Other     Social History   Socioeconomic History  . Marital status: Single    Spouse name: Not on file  . Number of children: Not on file  . Years of education: Not on file  . Highest education level: Not on file  Occupational History  . Not on file  Tobacco Use  . Smoking status: Never Smoker  . Smokeless tobacco: Never Used  Vaping Use  . Vaping Use: Never used  Substance and Sexual Activity  . Alcohol use: Yes    Alcohol/week: 1.0 standard drink    Types: 1 Cans of beer per week    Comment: social  . Drug use: No  . Sexual activity: Not Currently  Other Topics Concern  . 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 Difficulty of Paying Living Expenses: Not on file  Food Insecurity:   . Worried About Programme researcher, broadcasting/film/video in the Last Year: Not on file  . Ran Out of Food in the Last Year: Not on file  Transportation Needs:   . Lack of Transportation (Medical): Not on file  . Lack of Transportation (Non-Medical): Not on file  Physical Activity:   . Days of Exercise per Week: Not on file  . Minutes of Exercise per Session: Not on file  Stress:   . Feeling of Stress : Not on file  Social Connections:   . Frequency of Communication with Friends and Family: Not on file  . Frequency of Social Gatherings with Friends and Family: Not on file  . Attends  Religious Services: Not on file  . Active Member of Clubs or Organizations: Not on file  . Attends Banker Meetings: Not on file  . Marital Status: Not on file  Intimate Partner Violence:   . Fear of Current or Ex-Partner: Not on file  . Emotionally Abused: Not on file  . Physically Abused: Not on file  . Sexually Abused: Not on file    Outpatient Medications Prior to Visit  Medication Sig Dispense Refill  . FLUoxetine (PROZAC) 10 MG capsule Take one each morning for one week and then take 2 each morning. 60 capsule 1  . lansoprazole (PREVACID) 30 MG capsule Take one daily. 90 capsule 1  . metFORMIN (GLUCOPHAGE XR) 500 MG 24 hr tablet Take 1 tablet (500 mg total) by mouth daily with breakfast. 90 tablet 1  . Multiple Vitamins-Minerals (MULTIVITAL) tablet Take 1 tablet by mouth daily.      No facility-administered medications prior to visit.    Not on File  ROS Review of Systems  Constitutional: Negative for diaphoresis, fatigue, fever and unexpected weight  change.  HENT: Positive for congestion, postnasal drip, rhinorrhea and sore throat. Negative for sinus pressure and sinus pain.   Eyes: Negative for photophobia and visual disturbance.  Respiratory: Positive for cough. Negative for chest tightness, shortness of breath and wheezing.   Cardiovascular: Negative.   Gastrointestinal: Negative for diarrhea, nausea and vomiting.  Genitourinary: Negative.   Musculoskeletal: Negative for arthralgias and myalgias.  Skin: Negative for rash.  Allergic/Immunologic: Negative for immunocompromised state.  Neurological: Negative for seizures and speech difficulty.  Hematological: Negative.   Psychiatric/Behavioral: Negative.       Objective:    Physical Exam Vitals and nursing note reviewed.  Constitutional:      General: He is not in acute distress.    Appearance: Normal appearance. He is not ill-appearing, toxic-appearing or diaphoretic.  HENT:     Head: Normocephalic and atraumatic.  Eyes:     General: No scleral icterus.       Right eye: No discharge.        Left eye: No discharge.     Conjunctiva/sclera: Conjunctivae normal.  Pulmonary:     Effort: Pulmonary effort is normal.  Skin:    General: Skin is warm and dry.  Neurological:     Mental Status: He is alert.  Psychiatric:        Mood and Affect: Mood normal.        Behavior: Behavior normal.     Temp (!) 97 F (36.1 C) Comment: pt reported  Wt (!) 365 lb (165.6 kg) Comment: pt reported  BMI 50.91 kg/m  Wt Readings from Last 3 Encounters:  03/13/20 (!) 365 lb (165.6 kg)  03/01/20 (!) 366 lb (166 kg)  12/04/16 (!) 370 lb 6.4 oz (168 kg)     Health Maintenance Due  Topic Date Due  . Hepatitis C Screening  Never done  . PNEUMOCOCCAL POLYSACCHARIDE VACCINE AGE 19-64 HIGH RISK  Never done  . FOOT EXAM  Never done  . OPHTHALMOLOGY EXAM  Never done  . URINE MICROALBUMIN  Never done    There are no preventive care reminders to display for this patient.  Lab Results    Component Value Date   TSH 2.17 03/02/2020   Lab Results  Component Value Date   WBC 10.1 03/02/2020   HGB 14.5 03/02/2020   HCT 43.7 03/02/2020   MCV 93.4 03/02/2020   PLT 267.0 03/02/2020  Lab Results  Component Value Date   NA 136 03/02/2020   K 4.2 03/02/2020   CO2 25 03/02/2020   GLUCOSE 149 (H) 03/02/2020   BUN 13 03/02/2020   CREATININE 0.83 03/02/2020   BILITOT 1.1 03/02/2020   ALKPHOS 82 03/02/2020   AST 32 03/02/2020   ALT 20 03/02/2020   PROT 6.6 03/02/2020   ALBUMIN 3.9 03/02/2020   CALCIUM 9.0 03/02/2020   GFR 99.78 03/02/2020   Lab Results  Component Value Date   CHOL 184 03/02/2020   Lab Results  Component Value Date   HDL 35.20 (L) 03/02/2020   Lab Results  Component Value Date   LDLCALC 119 (H) 03/02/2020   Lab Results  Component Value Date   TRIG 147.0 03/02/2020   Lab Results  Component Value Date   CHOLHDL 5 03/02/2020   Lab Results  Component Value Date   HGBA1C 7.1 (H) 03/02/2020      Assessment & Plan:   Problem List Items Addressed This Visit      Other   COVID-19 - Primary      No orders of the defined types were placed in this encounter.   Follow-up: No follow-ups on file.  Advised him to quarantine through next Sunday.  Tylenol and OTC cough and cold preparations as needed.  We will follow-up with any shortness of breath or difficulty breathing.  Discussed quarantine at home measures.  Let us know the results of the Covid test.  Mliss Sax, MD   Virtual Visit via Video Note  I connected with Paul Sweeney. on 03/13/20 at  2:30 PM EDT by a video enabled telemedicine application and verified that I am speaking with the correct person using two identifiers.  Location: Patient: at home alone in his room.  Provider:    I discussed the limitations of evaluation and management by telemedicine and the availability of in person appointments. The patient expressed understanding and agreed to  proceed.  History of Present Illness:    Observations/Objective:   Assessment and Plan:   Follow Up Instructions:    I discussed the assessment and treatment plan with the patient. The patient was provided an opportunity to ask questions and all were answered. The patient agreed with the plan and demonstrated an understanding of the instructions.   The patient was advised to call back or seek an in-person evaluation if the symptoms worsen or if the condition fails to improve as anticipated.  I provided 20 minutes of non-face-to-face time during this encounter.   Mliss Sax, MD

## 2020-03-14 ENCOUNTER — Other Ambulatory Visit: Payer: Self-pay

## 2020-03-14 ENCOUNTER — Other Ambulatory Visit: Payer: Self-pay | Admitting: *Deleted

## 2020-03-14 ENCOUNTER — Other Ambulatory Visit: Payer: BC Managed Care – PPO

## 2020-03-14 DIAGNOSIS — Z20822 Contact with and (suspected) exposure to covid-19: Secondary | ICD-10-CM | POA: Diagnosis not present

## 2020-03-16 LAB — NOVEL CORONAVIRUS, NAA: SARS-CoV-2, NAA: NOT DETECTED

## 2020-03-16 LAB — SARS-COV-2, NAA 2 DAY TAT

## 2020-03-21 ENCOUNTER — Encounter: Payer: Self-pay | Admitting: Nurse Practitioner

## 2020-03-21 ENCOUNTER — Telehealth (INDEPENDENT_AMBULATORY_CARE_PROVIDER_SITE_OTHER): Payer: BC Managed Care – PPO | Admitting: Nurse Practitioner

## 2020-03-21 VITALS — Temp 97.9°F | Ht 71.0 in | Wt 366.0 lb

## 2020-03-21 DIAGNOSIS — J069 Acute upper respiratory infection, unspecified: Secondary | ICD-10-CM

## 2020-03-21 MED ORDER — GUAIFENESIN-DM 100-10 MG/5ML PO SYRP: 10.0000 mL | ORAL_SOLUTION | Freq: Four times a day (QID) | ORAL | 0 refills | Status: DC | PRN

## 2020-03-21 MED ORDER — BENZONATATE 100 MG PO CAPS: 100.0000 mg | ORAL_CAPSULE | Freq: Three times a day (TID) | ORAL | 0 refills | Status: DC | PRN

## 2020-03-21 NOTE — Progress Notes (Signed)
Virtual Visit via Video Note  I connected with@ on 03/21/20 at 11:00 AM EDT by a video enabled telemedicine application and verified that I am speaking with the correct person using two identifiers.  Location: Patient:Home Provider: Office Participants: patient and provider  I discussed the limitations of evaluation and management by telemedicine and the availability of in person appointments. I also discussed with the patient that there may be a patient responsible charge related to this service. The patient expressed understanding and agreed to proceed.  CC:Pt c/o productive cough with green mucus x1 week, pt was test for COVID last week and friday results came back negative. Pt states he has also been experiencing fatigue but the cough is something that he can not control.   History of Present Illness: URI  This is a new problem. The current episode started in the past 7 days. The problem has been gradually improving. There has been no fever. Associated symptoms include congestion and coughing. Pertinent negatives include no chest pain, diarrhea, dysuria, ear pain, headaches, joint pain, nausea, plugged ear sensation, rhinorrhea, sinus pain, sneezing, sore throat, swollen glands or wheezing. He has tried decongestant, acetaminophen, antihistamine and increased fluids for the symptoms. The treatment provided mild relief.  no SOB at rest or with exertion or PND Experiences fatigue after cough spells   Observations/Objective: Physical Exam Vitals reviewed.  Constitutional:      General: He is not in acute distress. Pulmonary:     Effort: Pulmonary effort is normal.    Assessment and Plan: Amaan was seen today for acute visit.  Diagnoses and all orders for this visit:  Viral upper respiratory tract infection -     benzonatate (TESSALON) 100 MG capsule; Take 1-2 capsules (100-200 mg total) by mouth 3 (three) times daily as needed. -     guaiFENesin-dextromethorphan (ROBITUSSIN DM)  100-10 MG/5ML syrup; Take 10 mLs by mouth every 6 (six) hours as needed for cough.   Follow Up Instructions: Continue symptom management Maintain adequate oral hydration   I discussed the assessment and treatment plan with the patient. The patient was provided an opportunity to ask questions and all were answered. The patient agreed with the plan and demonstrated an understanding of the instructions.   The patient was advised to call back or seek an in-person evaluation if the symptoms worsen or if the condition fails to improve as anticipated.  Alysia Penna, NP

## 2020-03-26 ENCOUNTER — Other Ambulatory Visit: Payer: Self-pay | Admitting: Family Medicine

## 2020-03-26 DIAGNOSIS — F39 Unspecified mood [affective] disorder: Secondary | ICD-10-CM

## 2020-04-02 ENCOUNTER — Other Ambulatory Visit: Payer: Self-pay

## 2020-04-02 ENCOUNTER — Ambulatory Visit (INDEPENDENT_AMBULATORY_CARE_PROVIDER_SITE_OTHER): Payer: BC Managed Care – PPO | Admitting: Family Medicine

## 2020-04-02 ENCOUNTER — Encounter: Payer: Self-pay | Admitting: Family Medicine

## 2020-04-02 VITALS — BP 132/68 | HR 89 | Temp 98.2°F | Ht 71.0 in | Wt 364.6 lb

## 2020-04-02 DIAGNOSIS — F39 Unspecified mood [affective] disorder: Secondary | ICD-10-CM

## 2020-04-02 DIAGNOSIS — J069 Acute upper respiratory infection, unspecified: Secondary | ICD-10-CM | POA: Diagnosis not present

## 2020-04-02 DIAGNOSIS — E1165 Type 2 diabetes mellitus with hyperglycemia: Secondary | ICD-10-CM

## 2020-04-02 MED ORDER — METFORMIN HCL ER 500 MG PO TB24: 500.0000 mg | ORAL_TABLET | Freq: Every day | ORAL | 1 refills | Status: DC

## 2020-04-02 MED ORDER — FLUOXETINE HCL 20 MG PO TABS: 20.0000 mg | ORAL_TABLET | Freq: Every day | ORAL | 1 refills | Status: DC

## 2020-04-02 NOTE — Patient Instructions (Signed)

## 2020-04-02 NOTE — Progress Notes (Addendum)
Established Patient Office Visit  Subjective:  Patient ID: Paul Donath., male    DOB: 1975/10/20  Age: 44 y.o. MRN: [id number]  CC:  Chief Complaint  Patient presents with  . Follow-up    1 month follow up on anxiety and DM, no concerns    HPI Paul Sweeney. presents for follow-up of diabetes and depression with anxiety.  Depression and anxiety are improving.  He is tolerating the 20 mg of fluoxetine without issue.  He is recovering from his recent upper respiratory illness.  Still feels little bit fatigued.  His smell and taste have returned.  He will be eligible for the Covid booster in late November.  He did receive the [employer] vaccine.  He is tolerating the Metformin.  Fasting sugars have been running around 120.  He has reduced the amount of carbohydrate in his diet.  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  Past Medical History:  Diagnosis Date  . Anxiety and depression    never hospitalized, remote  . Chicken pox   . Obesity   . Plantar fasciitis    calcaneal bone spur    Past Surgical History:  Procedure Laterality Date  . TONSILLECTOMY     age 50     Family History  Problem Relation Age of Onset  . Hypertension Mother   . Diabetes Mother   . High Cholesterol Mother   . Healthy Father   . Healthy Sister   . Healthy Brother   . Healthy Brother   . Healthy Brother   . Colon cancer Other   . Prostate cancer Other     Social History   Socioeconomic History  . Marital status: Single    Spouse name: Not on file  . Number of children: Not on file  . Years of education: Not on file  . Highest education level: Not on file  Occupational History  . Not on file  Tobacco Use  . Smoking status: Never Smoker  . Smokeless tobacco: Never Used  Vaping Use  . Vaping Use: Never used  Substance and Sexual Activity  . Alcohol use: Yes    Alcohol/week: 1.0 standard drink    Types: 1 Cans of beer per week     Comment: social  . Drug use: No  . Sexual activity: Not Currently  Other Topics Concern  . 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 Difficulty of Paying Living Expenses: Not on file  Food Insecurity:   . Worried About Programme researcher, broadcasting/film/video in the Last Year: Not on file  . Ran Out of Food in the Last Year: Not on file  Transportation Needs:   . Lack of Transportation (Medical): Not on file  . Lack of Transportation (Non-Medical): Not on file  Physical Activity:   . Days of Exercise per Week: Not on file  . Minutes of Exercise  per Session: Not on file  Stress:   . Feeling of Stress : Not on file  Social Connections:   . Frequency of Communication with Friends and Family: Not on file  . Frequency of Social Gatherings with Friends and Family: Not on file  . Attends Religious Services: Not on file  . Active Member of Clubs or Organizations: Not on file  . Attends Banker Meetings: Not on file  . Marital Status: Not on file  Intimate Partner Violence:   . Fear of Current or Ex-Partner: Not on file  . Emotionally Abused: Not on file  . Physically Abused: Not on file  . Sexually Abused: Not on file    Outpatient Medications Prior to Visit  Medication Sig Dispense Refill  . lansoprazole (PREVACID) 30 MG capsule Take one daily. 90 capsule 1  . Multiple Vitamins-Minerals (MULTIVITAL) tablet Take 1 tablet by mouth daily.     Marland Kitchen FLUoxetine (PROZAC) 10 MG capsule Take one each morning for one week and then take 2 each morning. 60 capsule 1  . metFORMIN (GLUCOPHAGE XR) 500 MG 24 hr tablet Take 1  tablet (500 mg total) by mouth daily with breakfast. 90 tablet 1  . benzonatate (TESSALON) 100 MG capsule Take 1-2 capsules (100-200 mg total) by mouth 3 (three) times daily as needed. (Patient not taking: Reported on 04/02/2020) 30 capsule 0  . guaiFENesin-dextromethorphan (ROBITUSSIN DM) 100-10 MG/5ML syrup Take 10 mLs by mouth every 6 (six) hours as needed for cough. (Patient not taking: Reported on 04/02/2020) 118 mL 0   No facility-administered medications prior to visit.    No Known Allergies  ROS Review of Systems  Constitutional: Negative.   HENT: Negative.   Eyes: Negative for photophobia and visual disturbance.  Respiratory: Negative.   Cardiovascular: Negative.   Gastrointestinal: Negative.   Endocrine: Negative for polyphagia and polyuria.  Genitourinary: Negative.   Musculoskeletal: Negative for gait problem and joint swelling.  Skin: Negative for pallor and rash.  Allergic/Immunologic: Negative for immunocompromised state.  Neurological: Negative for light-headedness and headaches.  Hematological: Does not bruise/bleed easily.      Objective:    Physical Exam Vitals and nursing note reviewed.  Constitutional:      Appearance: Normal appearance. He is obese.  HENT:     Head: Normocephalic and atraumatic.     Right Ear: External ear normal.     Left Ear: External ear normal.  Eyes:     General: No scleral icterus.       Right eye: No discharge.        Left eye: No discharge.     Conjunctiva/sclera: Conjunctivae normal.  Pulmonary:     Effort: Pulmonary effort is normal.  Neurological:     Mental Status: He is alert.  Psychiatric:        Mood and Affect: Mood normal.        Behavior: Behavior normal.    Depression screen [facility] 2/9 04/02/2020 03/01/2020 03/01/2020  Decreased Interest 1 3 1   Down, Depressed, Hopeless 1 1 0  PHQ - 2 Score 2 4 1   Altered sleeping 1 1 -  Tired, decreased energy 2 3 -  Change in appetite 1 1 -  Feeling bad or failure about  yourself  1 2 -  Trouble concentrating 1 1 -  Moving slowly or fidgety/restless 0 0 -  Suicidal thoughts 0 0 -  PHQ-9 Score 8 12 -  Difficult doing work/chores Somewhat difficult Somewhat difficult -  BP 132/68   Pulse 89   Temp 98.2 F (36.8 C) (Tympanic)   Ht 5\' 11"  (1.803 m)   Wt (!) 364 lb 9.6 oz (165.4 kg)   SpO2 96%   BMI 50.85 kg/m  Wt Readings from Last 3 Encounters:  04/02/20 (!) 364 lb 9.6 oz (165.4 kg)  03/21/20 (!) 366 lb (166 kg)  03/13/20 (!) 365 lb (165.6 kg)     Health Maintenance Due  Topic Date Due  . Hepatitis C Screening  Never done  . PNEUMOCOCCAL POLYSACCHARIDE VACCINE AGE 10-64 HIGH RISK  Never done  . FOOT EXAM  Never done  . OPHTHALMOLOGY EXAM  Never done  . URINE MICROALBUMIN  Never done    There are no preventive care reminders to display for this patient.  Lab Results  Component Value Date   TSH 2.17 03/02/2020   Lab Results  Component Value Date   WBC 10.1 03/02/2020   HGB 14.5 03/02/2020   HCT 43.7 03/02/2020   MCV 93.4 03/02/2020   PLT 267.0 03/02/2020   Lab Results  Component Value Date   NA 136 03/02/2020   K 4.2 03/02/2020   CO2 25 03/02/2020   GLUCOSE 149 (H) 03/02/2020   BUN 13 03/02/2020   CREATININE 0.83 03/02/2020   BILITOT 1.1 03/02/2020   ALKPHOS 82 03/02/2020   AST 32 03/02/2020   ALT 20 03/02/2020   PROT 6.6 03/02/2020   ALBUMIN 3.9 03/02/2020   CALCIUM 9.0 03/02/2020   GFR 99.78 03/02/2020   Lab Results  Component Value Date   CHOL 184 03/02/2020   Lab Results  Component Value Date   HDL 35.20 (L) 03/02/2020   Lab Results  Component Value Date   LDLCALC 119 (H) 03/02/2020   Lab Results  Component Value Date   TRIG 147.0 03/02/2020   Lab Results  Component Value Date   CHOLHDL 5 03/02/2020   Lab Results  Component Value Date   HGBA1C 7.1 (H) 03/02/2020      Assessment & Plan:   Problem List Items Addressed This Visit      Endocrine   Type 2 diabetes mellitus with hyperglycemia,  without long-term current use of insulin (HCC)   Relevant Medications   metFORMIN (GLUCOPHAGE XR) 500 MG 24 hr tablet     Other   Anxiety and Depression - Primary   Relevant Medications   FLUoxetine (PROZAC) 20 MG tablet      Meds ordered this encounter  Medications  . FLUoxetine (PROZAC) 20 MG tablet    Sig: Take 1 tablet (20 mg total) by mouth daily.    Dispense:  90 tablet    Refill:  1  . metFORMIN (GLUCOPHAGE XR) 500 MG 24 hr tablet    Sig: Take 1 tablet (500 mg total) by mouth daily with breakfast.    Dispense:  90 tablet    Refill:  1    Follow-up: Return Return in 2-3 months.  Continue current medicines.  Follow-up in 2 to 3 months.  Continue with weight watchers.  Advised COVID booster 8 months after his second dose.  03/04/2020, MD

## 2020-06-19 ENCOUNTER — Ambulatory Visit: Payer: BC Managed Care – PPO | Admitting: Family Medicine

## 2020-07-10 ENCOUNTER — Ambulatory Visit: Payer: BC Managed Care – PPO | Admitting: Family Medicine

## 2020-07-30 ENCOUNTER — Ambulatory Visit (INDEPENDENT_AMBULATORY_CARE_PROVIDER_SITE_OTHER): Payer: BC Managed Care – PPO | Admitting: Nurse Practitioner

## 2020-07-30 ENCOUNTER — Encounter: Payer: Self-pay | Admitting: Nurse Practitioner

## 2020-07-30 ENCOUNTER — Other Ambulatory Visit: Payer: Self-pay

## 2020-07-30 VITALS — BP 128/82 | HR 60 | Temp 97.5°F | Ht 71.0 in | Wt 376.4 lb

## 2020-07-30 DIAGNOSIS — S39012A Strain of muscle, fascia and tendon of lower back, initial encounter: Secondary | ICD-10-CM | POA: Diagnosis not present

## 2020-07-30 MED ORDER — CYCLOBENZAPRINE HCL 10 MG PO TABS: 10.0000 mg | ORAL_TABLET | Freq: Every day | ORAL | 0 refills | Status: DC

## 2020-07-30 MED ORDER — KETOROLAC TROMETHAMINE 30 MG/ML IJ SOLN
30.0000 mg | Freq: Once | INTRAMUSCULAR | Status: AC
  Administered 2020-07-30: 30 mg via INTRAMUSCULAR

## 2020-07-30 MED ORDER — IBUPROFEN 800 MG PO TABS: 800.0000 mg | ORAL_TABLET | Freq: Three times a day (TID) | ORAL | 0 refills | Status: DC | PRN

## 2020-07-30 NOTE — Patient Instructions (Signed)
Use warm compress after back stretches.  Sciatica Rehab Ask your health care provider which exercises are safe for you. Do exercises exactly as told by your health care provider and adjust them as directed. It is normal to feel mild stretching, pulling, tightness, or discomfort as you do these exercises. Stop right away if you feel sudden pain or your pain gets worse. Do not begin these exercises until told by your health care provider. Stretching and range-of-motion exercises These exercises warm up your muscles and joints and improve the movement and flexibility of your hips and back. These exercises also help to relieve pain, numbness, and tingling. Sciatic nerve glide 1. Sit in a chair with your head facing down toward your chest. Place your hands behind your back. Let your shoulders slump forward. 2. Slowly straighten one of your legs while you tilt your head back as if you are looking toward the ceiling. Only straighten your leg as far as you can without making your symptoms worse. 3. Hold this position for __________ seconds. 4. Slowly return to the starting position. 5. Repeat with your other leg. Repeat __________ times. Complete this exercise __________ times a day. Knee to chest with hip adduction and internal rotation 1. Lie on your back on a firm surface with both legs straight. 2. Bend one of your knees and move it up toward your chest until you feel a gentle stretch in your lower back and buttock. Then, move your knee toward the shoulder that is on the opposite side from your leg. This is hip adduction and internal rotation. ? Hold your leg in this position by holding on to the front of your knee. 3. Hold this position for __________ seconds. 4. Slowly return to the starting position. 5. Repeat with your other leg. Repeat __________ times. Complete this exercise __________ times a day.   Prone extension on elbows 1. Lie on your abdomen on a firm surface. A bed may be too soft for  this exercise. 2. Prop yourself up on your elbows. 3. Use your arms to help lift your chest up until you feel a gentle stretch in your abdomen and your lower back. ? This will place some of your body weight on your elbows. If this is uncomfortable, try stacking pillows under your chest. ? Your hips should stay down, against the surface that you are lying on. Keep your hip and back muscles relaxed. 4. Hold this position for __________ seconds. 5. Slowly relax your upper body and return to the starting position. Repeat __________ times. Complete this exercise __________ times a day.   Strengthening exercises These exercises build strength and endurance in your back. Endurance is the ability to use your muscles for a long time, even after they get tired. Pelvic tilt This exercise strengthens the muscles that lie deep in the abdomen. 1. Lie on your back on a firm surface. Bend your knees and keep your feet flat on the floor. 2. Tense your abdominal muscles. Tip your pelvis up toward the ceiling and flatten your lower back into the floor. ? To help with this exercise, you may place a small towel under your lower back and try to push your back into the towel. 3. Hold this position for __________ seconds. 4. Let your muscles relax completely before you repeat this exercise. Repeat __________ times. Complete this exercise __________ times a day. Alternating arm and leg raises 1. Get on your hands and knees on a firm surface. If you are on  a hard floor, you may want to use padding, such as an exercise mat, to cushion your knees. 2. Line up your arms and legs. Your hands should be directly below your shoulders, and your knees should be directly below your hips. 3. Lift your left leg behind you. At the same time, raise your right arm and straighten it in front of you. ? Do not lift your leg higher than your hip. ? Do not lift your arm higher than your shoulder. ? Keep your abdominal and back muscles  tight. ? Keep your hips facing the ground. ? Do not arch your back. ? Keep your balance carefully, and do not hold your breath. 4. Hold this position for __________ seconds. 5. Slowly return to the starting position. 6. Repeat with your right leg and your left arm. Repeat __________ times. Complete this exercise __________ times a day.   Posture and body mechanics Good posture and healthy body mechanics can help to relieve stress in your body's tissues and joints. Body mechanics refers to the movements and positions of your body while you do your daily activities. Posture is part of body mechanics. Good posture means:  Your spine is in its natural S-curve position (neutral).  Your shoulders are pulled back slightly.  Your head is not tipped forward. Follow these guidelines to improve your posture and body mechanics in your everyday activities. Standing  When standing, keep your spine neutral and your feet about hip width apart. Keep a slight bend in your knees. Your ears, shoulders, and hips should line up.  When you do a task in which you stand in one place for a long time, place one foot up on a stable object that is 2-4 inches (5-10 cm) high, such as a footstool. This helps keep your spine neutral.   Sitting  When sitting, keep your spine neutral and keep your feet flat on the floor. Use a footrest, if necessary, and keep your thighs parallel to the floor. Avoid rounding your shoulders, and avoid tilting your head forward.  When working at a desk or a computer, keep your desk at a height where your hands are slightly lower than your elbows. Slide your chair under your desk so you are close enough to maintain good posture.  When working at a computer, place your monitor at a height where you are looking straight ahead and you do not have to tilt your head forward or downward to look at the screen.   Resting  When lying down and resting, avoid positions that are most painful for  you.  If you have pain with activities such as sitting, bending, stooping, or squatting, lie in a position in which your body does not bend very much. For example, avoid curling up on your side with your arms and knees near your chest (fetal position).  If you have pain with activities such as standing for a long time or reaching with your arms, lie with your spine in a neutral position and bend your knees slightly. Try the following positions: ? Lying on your side with a pillow between your knees. ? Lying on your back with a pillow under your knees. Lifting  When lifting objects, keep your feet at least shoulder width apart and tighten your abdominal muscles.  Bend your knees and hips and keep your spine neutral. It is important to lift using the strength of your legs, not your back. Do not lock your knees straight out.  Always ask for  help to lift heavy or awkward objects.   This information is not intended to replace advice given to you by your health care provider. Make sure you discuss any questions you have with your health care provider. Document Revised: 10/15/2018 Document Reviewed: 07/15/2018 Elsevier Patient Education  2021 [employer].

## 2020-07-30 NOTE — Progress Notes (Signed)
Subjective:  Patient ID: Paul Sweeney., male    DOB: [birth year]  Age: 45 y.o. MRN: [id number]  CC: Acute Visit (Pt c/o low back pain with left leg pain x2 days. Pt denies any known injury. )  Back Pain This is a recurrent problem. The current episode started more than 1 month ago. The problem occurs intermittently. The problem has been waxing and waning since onset. The pain is present in the lumbar spine. The quality of the pain is described as aching and cramping. The pain radiates to the left thigh. The pain is moderate. The pain is the same all the time. The symptoms are aggravated by bending and twisting. Associated symptoms include leg pain. Pertinent negatives include no bladder incontinence, bowel incontinence, chest pain, dysuria, fever, headaches, numbness, paresis, paresthesias, pelvic pain, perianal numbness, tingling, weakness or weight loss. Risk factors include obesity, poor posture, sedentary lifestyle and lack of exercise. He has tried NSAIDs for the symptoms. The treatment provided mild relief.   Reviewed past Medical, Social and Family history today.  Outpatient Medications Prior to Visit  Medication Sig Dispense Refill  . FLUoxetine (PROZAC) 20 MG tablet Take 1 tablet (20 mg total) by mouth daily. 90 tablet 1  . lansoprazole (PREVACID) 30 MG capsule Take one daily. 90 capsule 1  . metFORMIN (GLUCOPHAGE XR) 500 MG 24 hr tablet Take 1 tablet (500 mg total) by mouth daily with breakfast. 90 tablet 1  . Multiple Vitamins-Minerals (MULTIVITAL) tablet Take 1 tablet by mouth daily.      No facility-administered medications prior to visit.    ROS See HPI  Objective:  BP 128/82 (BP Location: Left Arm, Patient Position: Sitting, Cuff Size: Large)   Pulse 60   Temp (!) 97.5 F (36.4 C) (Temporal)   Ht 5\' 11"  (1.803 m)   Wt (!) 376 lb 6.4 oz (170.7 kg)   SpO2 98%   BMI 52.50 kg/m   Physical Exam Vitals reviewed.  Constitutional:      Appearance: He is obese.   Pulmonary:     Effort: Pulmonary effort is normal.  Musculoskeletal:        General: Tenderness present.     Lumbar back: Tenderness present. Normal range of motion. Negative right straight leg raise test and negative left straight leg raise test.     Right hip: Normal.     Left hip: Normal.     Right upper leg: Normal.     Left upper leg: Normal.     Right lower leg: No edema.     Left lower leg: No edema.  Skin:    Findings: No erythema or rash.  Neurological:     Mental Status: He is alert and oriented to person, place, and time.    Assessment & Plan:  This visit occurred during the SARS-CoV-2 public health emergency.  Safety protocols were in place, including screening questions prior to the visit, additional usage of staff PPE, and extensive cleaning of exam room while observing appropriate contact time as indicated for disinfecting solutions.   Rashun was seen today for acute visit.  Diagnoses and all orders for this visit:  Strain of lumbar paraspinal muscle, initial encounter -     ibuprofen (ADVIL) 800 MG tablet; Take 1 tablet (800 mg total) by mouth every 8 (eight) hours as needed. With food -     cyclobenzaprine (FLEXERIL) 10 MG tablet; Take 1 tablet (10 mg total) by mouth at bedtime. -  ketorolac (TORADOL) 30 MG/ML injection 30 mg   Problem List Items Addressed This Visit   None   Visit Diagnoses    Strain of lumbar paraspinal muscle, initial encounter    -  Primary   Relevant Medications   ibuprofen (ADVIL) 800 MG tablet   cyclobenzaprine (FLEXERIL) 10 MG tablet   ketorolac (TORADOL) 30 MG/ML injection 30 mg (Start on 07/30/2020  4:00 PM)      Follow-up: Return if symptoms worsen or fail to improve.  Alysia Penna, NP

## 2020-08-10 ENCOUNTER — Ambulatory Visit: Payer: BC Managed Care – PPO | Admitting: Family Medicine

## 2020-08-13 ENCOUNTER — Other Ambulatory Visit: Payer: Self-pay

## 2020-08-14 ENCOUNTER — Ambulatory Visit: Payer: BC Managed Care – PPO | Admitting: Family Medicine

## 2020-08-25 ENCOUNTER — Other Ambulatory Visit: Payer: Self-pay | Admitting: Family Medicine

## 2020-08-25 DIAGNOSIS — K219 Gastro-esophageal reflux disease without esophagitis: Secondary | ICD-10-CM

## 2020-08-27 ENCOUNTER — Telehealth: Payer: Self-pay | Admitting: Family Medicine

## 2020-08-27 ENCOUNTER — Encounter: Payer: Self-pay | Admitting: Family Medicine

## 2020-08-27 NOTE — Telephone Encounter (Signed)
Pt was no show for appt 08/14/2020 for follow up. 1st occurrence. Fee waived. Letter mailed.  PCP,  Please reply back with corresponding letter matching appropriate follow up needs.  A - No follow up necessary B - Follow up urgent - locate patient immediately to schedule appointment. C - Follow up necessary. Contact patient and schedule visit w/in 7 days. D - Follow up necessary. Contact patient and schedule visit w/in 2-4 weeks.  E - Follow up necessary. Contact patient and schedule visit w/in 3 months.

## 2020-08-27 NOTE — Telephone Encounter (Signed)
Last ov 07/30/20 Last fill 03/01/20

## 2020-10-01 ENCOUNTER — Other Ambulatory Visit: Payer: Self-pay | Admitting: Family Medicine

## 2020-10-01 DIAGNOSIS — F39 Unspecified mood [affective] disorder: Secondary | ICD-10-CM

## 2020-12-03 ENCOUNTER — Other Ambulatory Visit: Payer: Self-pay | Admitting: Family Medicine

## 2020-12-03 DIAGNOSIS — E1165 Type 2 diabetes mellitus with hyperglycemia: Secondary | ICD-10-CM

## 2021-03-11 ENCOUNTER — Other Ambulatory Visit: Payer: Self-pay | Admitting: Family Medicine

## 2021-03-11 DIAGNOSIS — K219 Gastro-esophageal reflux disease without esophagitis: Secondary | ICD-10-CM

## 2021-03-12 NOTE — Telephone Encounter (Signed)
Called patient to schedule appointment no answer LMTCB to schedule appointment for follow up on medications.

## 2021-03-29 ENCOUNTER — Encounter: Payer: Self-pay | Admitting: Family Medicine

## 2021-03-29 ENCOUNTER — Ambulatory Visit (INDEPENDENT_AMBULATORY_CARE_PROVIDER_SITE_OTHER): Payer: BC Managed Care – PPO | Admitting: Family Medicine

## 2021-03-29 ENCOUNTER — Other Ambulatory Visit: Payer: Self-pay

## 2021-03-29 VITALS — BP 130/88 | HR 64 | Temp 97.3°F | Resp 18 | Wt 352.8 lb

## 2021-03-29 DIAGNOSIS — Z23 Encounter for immunization: Secondary | ICD-10-CM

## 2021-03-29 DIAGNOSIS — R058 Other specified cough: Secondary | ICD-10-CM | POA: Diagnosis not present

## 2021-03-29 DIAGNOSIS — G4733 Obstructive sleep apnea (adult) (pediatric): Secondary | ICD-10-CM

## 2021-03-29 DIAGNOSIS — Z0000 Encounter for general adult medical examination without abnormal findings: Secondary | ICD-10-CM

## 2021-03-29 MED ORDER — PREDNISONE 20 MG PO TABS: 20.0000 mg | ORAL_TABLET | Freq: Two times a day (BID) | ORAL | 0 refills | Status: AC

## 2021-03-29 MED ORDER — BENZONATATE 200 MG PO CAPS: 200.0000 mg | ORAL_CAPSULE | Freq: Two times a day (BID) | ORAL | 0 refills | Status: DC | PRN

## 2021-03-29 NOTE — Progress Notes (Signed)
Established Patient Office Visit  Subjective:  Patient ID: Paul Raineri., male    DOB: [birth year]  Age: 45 y.o. MRN: [id number]  CC:  Chief Complaint  Patient presents with   Acute Visit    Pt c/o coughing that keeps him up at night x  week pt staes he had cold but sx have subsided other than cough that is most prominent at night. Pt would like flu shot on today.     HPI Paul Sweeney. presents for evaluation and treatment of an ongoing dry nonproductive cough status post URI symptoms that have mostly resolved except for the cough.  Denies wheezing tightness in the chest or history of asthma.  He does not smoke.  There is no phlegm.  This illness is run through the house including his wife and kids.  Everyone had tested negative for COVID.  He has had his COVID vaccination series and requests a flu shot today.  Needs a CPAP machine.  He is overdue for follow-up of depression, diabetes and hypertension.    Past Medical History:  Diagnosis Date   Anxiety and depression    never hospitalized, remote   Chicken pox    Obesity    Plantar fasciitis    calcaneal bone spur    Past Surgical History:  Procedure Laterality Date   TONSILLECTOMY     age 52     Family History  Problem Relation Age of Onset   Hypertension Mother    Diabetes Mother    High Cholesterol Mother    Healthy Father    Healthy Sister    Healthy Brother    Healthy Brother    Healthy Brother    Colon cancer Other    Prostate cancer Other     Social History   Socioeconomic History   Marital status: Single    Spouse name: Not on file   Number of children: Not on file   Years of education: Not on file   Highest education level: Not on file  Occupational History   Not on file  Tobacco Use   Smoking status: Never   Smokeless tobacco: Never  Vaping Use   Vaping Use: Never used  Substance and Sexual Activity   Alcohol use: Yes    Alcohol/week: 1.0 standard drink    Types: 1 Cans of beer  per week    Comment: social   Drug use: No   Sexual activity: Not Currently  Other Topics Concern   Not on file  Social History Narrative   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 Situation: lives with kids (12, 8 and 6 in 2014)      Spiritual Beliefs:      Lifestyle: exercises daily - gym 5 days, CV, swimming, weights; diet is improving - son has glut-1 and on adkins type diet, pt has lost 30 lbs in last few months            Social Determinants of Health   Financial Resource Strain: Not on file  Food Insecurity: Not on file  Transportation Needs: Not on file  Physical Activity: Not on file  Stress: Not on file  Social Connections: Not on file  Intimate Partner Violence: Not on file    Outpatient Medications Prior to Visit  Medication Sig Dispense Refill   FLUoxetine (PROZAC) 20 MG tablet TAKE 1 TABLET BY MOUTH EVERY DAY 90 tablet 1   ibuprofen (  ADVIL) 800 MG tablet Take 1 tablet (800 mg total) by mouth every 8 (eight) hours as needed. With food 21 tablet 0   lansoprazole (PREVACID) 30 MG capsule TAKE 1 CAPSULE BY MOUTH EVERY DAY 90 capsule 0   metFORMIN (GLUCOPHAGE-XR) 500 MG 24 hr tablet TAKE 1 TABLET BY MOUTH EVERY DAY WITH BREAKFAST 90 tablet 1   Multiple Vitamins-Minerals (MULTIVITAL) tablet Take 1 tablet by mouth daily.      cyclobenzaprine (FLEXERIL) 10 MG tablet Take 1 tablet (10 mg total) by mouth at bedtime. (Patient not taking: Reported on 03/29/2021) 14 tablet 0   No facility-administered medications prior to visit.    No Known Allergies  ROS Review of Systems  Constitutional:  Negative for diaphoresis, fatigue, fever and unexpected weight change.  HENT:  Positive for sore throat. Negative for postnasal drip.   Eyes:  Negative for photophobia and visual disturbance.  Respiratory:  Positive for apnea and cough. Negative for chest tightness, shortness of breath and wheezing.   Cardiovascular: Negative.   Gastrointestinal: Negative.    Musculoskeletal:  Negative for arthralgias and myalgias.  Neurological:  Negative for headaches.     Objective:    Physical Exam Vitals and nursing note reviewed.  Constitutional:      General: He is not in acute distress.    Appearance: Normal appearance. He is obese. He is not ill-appearing, toxic-appearing or diaphoretic.  HENT:     Head: Normocephalic and atraumatic.     Right Ear: Tympanic membrane, ear canal and external ear normal.     Left Ear: Tympanic membrane, ear canal and external ear normal.     Mouth/Throat:     Mouth: Mucous membranes are moist.     Pharynx: Oropharynx is clear. No oropharyngeal exudate or posterior oropharyngeal erythema.   Eyes:     Extraocular Movements: Extraocular movements intact.     Conjunctiva/sclera: Conjunctivae normal.     Pupils: Pupils are equal, round, and reactive to light.  Neck:     Vascular: No carotid bruit.  Cardiovascular:     Rate and Rhythm: Normal rate and regular rhythm.  Pulmonary:     Effort: Pulmonary effort is normal. No respiratory distress.     Breath sounds: Normal breath sounds. No stridor. No rhonchi.  Musculoskeletal:     Cervical back: No rigidity or tenderness.  Lymphadenopathy:     Cervical: No cervical adenopathy.  Skin:    General: Skin is warm and dry.  Neurological:     Mental Status: He is alert and oriented to person, place, and time.  Psychiatric:        Mood and Affect: Mood normal.        Behavior: Behavior normal.    BP 130/88 (BP Location: Left Arm, Patient Position: Sitting, Cuff Size: Large)   Pulse 64   Temp (!) 97.3 F (36.3 C) (Temporal)   Resp 18   Wt (!) 352 lb 12.8 oz (160 kg)   SpO2 99%   BMI 49.21 kg/m  Wt Readings from Last 3 Encounters:  03/29/21 (!) 352 lb 12.8 oz (160 kg)  07/30/20 (!) 376 lb 6.4 oz (170.7 kg)  04/02/20 (!) 364 lb 9.6 oz (165.4 kg)     Health Maintenance Due  Topic Date Due   FOOT EXAM  Never done   OPHTHALMOLOGY EXAM  Never done   URINE  MICROALBUMIN  Never done   Hepatitis C Screening  Never done   COLONOSCOPY (Pts 45-53yrs Insurance coverage will need  to be confirmed)  Never done   COVID-19 Vaccine (3 - Booster for Pfizer series) 04/01/2020   HEMOGLOBIN A1C  09/02/2020   INFLUENZA VACCINE  02/04/2021    There are no preventive care reminders to display for this patient.  Lab Results  Component Value Date   TSH 2.17 03/02/2020   Lab Results  Component Value Date   WBC 10.1 03/02/2020   HGB 14.5 03/02/2020   HCT 43.7 03/02/2020   MCV 93.4 03/02/2020   PLT 267.0 03/02/2020   Lab Results  Component Value Date   NA 136 03/02/2020   K 4.2 03/02/2020   CO2 25 03/02/2020   GLUCOSE 149 (H) 03/02/2020   BUN 13 03/02/2020   CREATININE 0.83 03/02/2020   BILITOT 1.1 03/02/2020   ALKPHOS 82 03/02/2020   AST 32 03/02/2020   ALT 20 03/02/2020   PROT 6.6 03/02/2020   ALBUMIN 3.9 03/02/2020   CALCIUM 9.0 03/02/2020   GFR 99.78 03/02/2020   Lab Results  Component Value Date   CHOL 184 03/02/2020   Lab Results  Component Value Date   HDL 35.20 (L) 03/02/2020   Lab Results  Component Value Date   LDLCALC 119 (H) 03/02/2020   Lab Results  Component Value Date   TRIG 147.0 03/02/2020   Lab Results  Component Value Date   CHOLHDL 5 03/02/2020   Lab Results  Component Value Date   HGBA1C 7.1 (H) 03/02/2020      Assessment & Plan:   Problem List Items Addressed This Visit       Respiratory   Obstructive sleep apnea   Relevant Orders   Ambulatory referral to Pulmonology   Post-viral cough syndrome - Primary   Relevant Medications   predniSONE (DELTASONE) 20 MG tablet   benzonatate (TESSALON) 200 MG capsule   Other Relevant Orders   Novel Coronavirus, NAA ([facility])     Other   Healthcare maintenance   Relevant Orders   Ambulatory referral to Gastroenterology   Other Visit Diagnoses     Need for vaccination for H flu type B       Relevant Orders   Flu Vaccine QUAD 6+ mos PF IM (Fluarix  Quad PF)       Meds ordered this encounter  Medications   predniSONE (DELTASONE) 20 MG tablet    Sig: Take 1 tablet (20 mg total) by mouth 2 (two) times daily with a meal for 7 days.    Dispense:  14 tablet    Refill:  0   benzonatate (TESSALON) 200 MG capsule    Sig: Take 1 capsule (200 mg total) by mouth 2 (two) times daily as needed for cough.    Dispense:  20 capsule    Refill:  0    Follow-up: Return Follow-up fasting soon for recheck of diabetes, hypertension and physical exam..   Checking PCR for COVID.  Prednisone burst and Tessalon Perles for postviral cough syndrome.  Flu vaccine today.  Agrees to go for consultation for colonoscopy and would like to see sleep doctor for a new CPAP machine.  Plans on follow-up with me for physical and ongoing chronic health issues in the near future. Mliss Sax, MD

## 2021-04-03 ENCOUNTER — Telehealth: Payer: Self-pay | Admitting: Family Medicine

## 2021-04-03 NOTE — Telephone Encounter (Signed)
Please advise message below. Will you be able to fill out short term disability forms for patient?

## 2021-04-04 NOTE — Telephone Encounter (Signed)
Patient aware that short term disability forms could be filled out for 7 days after visit with Dr. Doreene Burke. Per patient he will check on having forms faxed over to be filled out. CPE scheduled.

## 2021-04-25 ENCOUNTER — Other Ambulatory Visit: Payer: Self-pay | Admitting: Family Medicine

## 2021-04-25 DIAGNOSIS — F39 Unspecified mood [affective] disorder: Secondary | ICD-10-CM

## 2021-05-16 ENCOUNTER — Encounter: Payer: BC Managed Care – PPO | Admitting: Family Medicine

## 2021-07-18 ENCOUNTER — Other Ambulatory Visit: Payer: Self-pay | Admitting: Family Medicine

## 2021-07-18 DIAGNOSIS — E1165 Type 2 diabetes mellitus with hyperglycemia: Secondary | ICD-10-CM

## 2021-08-08 ENCOUNTER — Encounter: Payer: Self-pay | Admitting: Pulmonary Disease

## 2021-08-08 ENCOUNTER — Other Ambulatory Visit: Payer: Self-pay

## 2021-08-08 ENCOUNTER — Ambulatory Visit (INDEPENDENT_AMBULATORY_CARE_PROVIDER_SITE_OTHER): Payer: BC Managed Care – PPO | Admitting: Pulmonary Disease

## 2021-08-08 VITALS — BP 132/74 | HR 61 | Temp 98.0°F | Ht 71.0 in | Wt 353.0 lb

## 2021-08-08 DIAGNOSIS — G4733 Obstructive sleep apnea (adult) (pediatric): Secondary | ICD-10-CM

## 2021-08-08 NOTE — Assessment & Plan Note (Signed)
He has severe OSA.  His break in therapy is simply because he has not been able to get replacement CPAP supplies.  We will send in a prescription to DME to provide him with supplies so that he can get back on his machine.  He may need a new humidifier has also filters and a new CPAP hose and mask.  Nasal mask seems to have worked for him.  We will also schedule a CPAP titration study and tweak settings on his CPAP as needed. Emphasized need for compliance  Weight loss encouraged, compliance with goal of at least 4-6 hrs every night is the expectation. Advised against medications with sedative side effects Cautioned against driving when sleepy - understanding that sleepiness will vary on a day to day basis

## 2021-08-08 NOTE — Assessment & Plan Note (Signed)
Direct correlation between OSA and weight was discussed. Weight loss measures were encouraged

## 2021-08-08 NOTE — Progress Notes (Signed)
Subjective:    Patient ID: Paul Sweeney., male    DOB: 30-May-1976, 46 y.o.   MRN: [id number]  HPI  Chief Complaint  Patient presents with   Consult    Consult for sleep study. Pt states he had a sleep study done in 2018, and his old machine is old and he needs a new machine now. Has a nasal mask    46 year old morbidly obese man presents to establish care for OSA. He was last seen in 2018, set up with a home sleep test which demonstrated severe OSA with AHI 44/overanalyze desaturation of 69%.  He was then set up with auto CPAP with a nasal mask 5 to 20 cm which she used for for quite a few years this helped improve his daytime somnolence and fatigue.  For some reason he did not get replacement mask, or hose and then eventually stopped using his machine.  He has not used the machine in the last year due to lack of replacement parts.  He wonders if his machine can be replaced now. He has lost weight from 378 to his current weight of 353 pounds  Epworth sleepiness score is 14 and reports sleepiness while sitting and reading, watching TV, sitting inactive in a public place or lying down to rest in the afternoons.  His kids who live with them have noted loud snoring and witnessed apneas. Bedtime is around 9:30 PM, sleep latency is 30 minutes after he takes 5 mg of melatonin, he sleeps on his back with 2 pillows and moves from side to side, reports multiple nocturnal awakenings including nocturia and is out of bed at 6 AM feeling tired with dryness of mouth but denies headaches  There is no history suggestive of cataplexy, sleep paralysis or parasomnias   Significant tests/ events reviewed  HST 01/2017 AHI 44/h, lowest desat 69%  Past Medical History:  Diagnosis Date   Anxiety and depression    never hospitalized, remote   Chicken pox    Obesity    Plantar fasciitis    calcaneal bone spur     Past Surgical History:  Procedure Laterality Date   TONSILLECTOMY     age 18      No Known Allergies  Social History   Socioeconomic History   Marital status: Single    Spouse name: Not on file   Number of children: Not on file   Years of education: Not on file   Highest education level: Not on file  Occupational History   Not on file  Tobacco Use   Smoking status: Never   Smokeless tobacco: Never  Vaping Use   Vaping Use: Never used  Substance and Sexual Activity   Alcohol use: Yes    Alcohol/week: 1.0 standard drink    Types: 1 Cans of beer per week    Comment: social   Drug use: No   Sexual activity: Not Currently  Other Topics Concern   Not on file  Social History Narrative   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, [employer] Situation: lives with kids (61, 5 and 6 in 2014)      Spiritual Beliefs:      Lifestyle: exercises daily - gym 5 days, CV, swimming, weights; diet is improving - son has glut-1 and on adkins type diet, pt has lost 30 lbs in last few months            Social Determinants of  Health   Financial Resource Strain: Not on file  Food Insecurity: Not on file  Transportation Needs: Not on file  Physical Activity: Not on file  Stress: Not on file  Social Connections: Not on file  Intimate Partner Violence: Not on file     Family History  Problem Relation Age of Onset   Hypertension Mother    Diabetes Mother    High Cholesterol Mother    Healthy Father    Healthy Sister    Healthy Brother    Healthy Brother    Healthy Brother    Colon cancer Other    Prostate cancer Other      Review of Systems Anxiety and depression  Constitutional: negative for anorexia, fevers and sweats  Eyes: negative for irritation, redness and visual disturbance  Ears, nose, mouth, throat, and face: negative for earaches, epistaxis, nasal congestion and sore throat  Respiratory: negative for cough, dyspnea on exertion, sputum and wheezing  Cardiovascular: negative for chest pain, dyspnea, lower extremity edema, orthopnea,  palpitations and syncope  Gastrointestinal: negative for abdominal pain, constipation, diarrhea, melena, nausea and vomiting  Genitourinary:negative for dysuria, frequency and hematuria  Hematologic/lymphatic: negative for bleeding, easy bruising and lymphadenopathy  Musculoskeletal:negative for arthralgias, muscle weakness and stiff joints  Neurological: negative for coordination problems, gait problems, headaches and weakness  Endocrine: negative for diabetic symptoms including polydipsia, polyuria and weight loss     Objective:   Physical Exam  Gen. Pleasant, obese, in no distress, normal affect ENT - no pallor,icterus, no post nasal drip, class 2-3 airway Neck: No JVD, no thyromegaly, no carotid bruits Lungs: no use of accessory muscles, no dullness to percussion, decreased without rales or rhonchi  Cardiovascular: Rhythm regular, heart sounds  normal, no murmurs or gallops, no peripheral edema Abdomen: soft and non-tender, no hepatosplenomegaly, BS normal. Musculoskeletal: No deformities, no cyanosis or clubbing Neuro:  alert, non focal, no tremors       Assessment & Plan:

## 2021-08-08 NOTE — Patient Instructions (Signed)
°  X CPAP titration study  X Rx to [facility] for new filters, new nasal mask & supplies  You may be eligible for a new machine end of 2023

## 2021-08-11 ENCOUNTER — Other Ambulatory Visit: Payer: Self-pay | Admitting: Family Medicine

## 2021-08-11 DIAGNOSIS — E1165 Type 2 diabetes mellitus with hyperglycemia: Secondary | ICD-10-CM

## 2021-08-13 ENCOUNTER — Telehealth: Payer: Self-pay | Admitting: Pulmonary Disease

## 2021-08-13 NOTE — Telephone Encounter (Signed)
Sending a message to [facility]. Waiting on a response.

## 2021-08-14 NOTE — Telephone Encounter (Signed)
Pelease advise if you have heard back

## 2021-08-15 NOTE — Telephone Encounter (Signed)
I have sent a message back to [facility]. They will contact patient about CPAP.

## 2021-08-21 DIAGNOSIS — G4733 Obstructive sleep apnea (adult) (pediatric): Secondary | ICD-10-CM | POA: Diagnosis not present

## 2021-09-09 ENCOUNTER — Ambulatory Visit (HOSPITAL_BASED_OUTPATIENT_CLINIC_OR_DEPARTMENT_OTHER): Payer: BC Managed Care – PPO | Attending: Pulmonary Disease | Admitting: Pulmonary Disease

## 2021-10-05 DIAGNOSIS — G4733 Obstructive sleep apnea (adult) (pediatric): Secondary | ICD-10-CM | POA: Diagnosis not present

## 2021-10-15 ENCOUNTER — Other Ambulatory Visit: Payer: Self-pay | Admitting: Family Medicine

## 2021-10-15 DIAGNOSIS — K219 Gastro-esophageal reflux disease without esophagitis: Secondary | ICD-10-CM

## 2021-10-24 ENCOUNTER — Other Ambulatory Visit: Payer: Self-pay | Admitting: Family Medicine

## 2021-10-24 DIAGNOSIS — F39 Unspecified mood [affective] disorder: Secondary | ICD-10-CM

## 2021-11-05 ENCOUNTER — Ambulatory Visit: Payer: BC Managed Care – PPO | Admitting: Primary Care

## 2021-11-09 ENCOUNTER — Other Ambulatory Visit: Payer: Self-pay | Admitting: Family Medicine

## 2021-11-09 DIAGNOSIS — K219 Gastro-esophageal reflux disease without esophagitis: Secondary | ICD-10-CM

## 2021-11-11 ENCOUNTER — Ambulatory Visit: Payer: BC Managed Care – PPO | Admitting: Primary Care

## 2021-12-13 NOTE — Telephone Encounter (Signed)
Appointment scheduled.

## 2021-12-23 ENCOUNTER — Ambulatory Visit (INDEPENDENT_AMBULATORY_CARE_PROVIDER_SITE_OTHER): Payer: BC Managed Care – PPO | Admitting: Family Medicine

## 2021-12-23 ENCOUNTER — Encounter: Payer: Self-pay | Admitting: Family Medicine

## 2021-12-23 DIAGNOSIS — F39 Unspecified mood [affective] disorder: Secondary | ICD-10-CM | POA: Diagnosis not present

## 2021-12-23 DIAGNOSIS — E1165 Type 2 diabetes mellitus with hyperglycemia: Secondary | ICD-10-CM

## 2021-12-23 MED ORDER — METFORMIN HCL ER 500 MG PO TB24: ORAL_TABLET | ORAL | 1 refills | Status: DC

## 2021-12-23 MED ORDER — FLUOXETINE HCL 20 MG PO TABS: 20.0000 mg | ORAL_TABLET | Freq: Every day | ORAL | 0 refills | Status: DC

## 2021-12-23 MED ORDER — TIRZEPATIDE 5 MG/0.5ML ~~LOC~~ SOAJ: 5.0000 mg | SUBCUTANEOUS | 2 refills | Status: DC

## 2021-12-23 NOTE — Patient Instructions (Addendum)
Again this agonist my 6 in the laboratory aware that GLP-1 agonists have been associated with thyroid C cancers soft

## 2021-12-23 NOTE — Progress Notes (Signed)
Established Patient Office Visit  Subjective   Patient ID: Paul Sweeney., male    DOB: 04-08-1976  Age: 46 y.o. MRN: [id number]  Chief Complaint  Patient presents with   Follow-up    Follow up on medication. No questions/concerns    HPI for follow-up of diabetes and depression with anxiety.  Lost to follow-up.  Special needs son had been in the hospital for a month for treatment of a UTI.  He has recently become more active by swimming and walking.  He has been out all of his medicines for some time now.  Fluoxetine had helped a great Deal while he was taking it.  He had no issues with metformin.  He has no history of thyroid disease.  There is no family history of thyroid disease.    Review of Systems  Constitutional: Negative.   HENT: Negative.    Eyes:  Negative for blurred vision, discharge and redness.  Respiratory: Negative.    Cardiovascular: Negative.   Gastrointestinal:  Negative for abdominal pain.  Genitourinary: Negative.   Musculoskeletal: Negative.  Negative for myalgias.  Skin:  Negative for rash.  Neurological:  Negative for tingling, loss of consciousness and weakness.  Endo/Heme/Allergies:  Negative for polydipsia.      Objective:     BP 118/72 (BP Location: Left Arm, Patient Position: Sitting, Cuff Size: Large)   Pulse (!) 57   Temp 97.6 F (36.4 C) (Temporal)   Ht 5\' 11"  (1.803 m)   Wt (!) 367 lb (166.5 kg)   SpO2 98%   BMI 51.19 kg/m  Wt Readings from Last 3 Encounters:  12/23/21 (!) 367 lb (166.5 kg)  08/08/21 (!) 353 lb (160.1 kg)  03/29/21 (!) 352 lb 12.8 oz (160 kg)      Physical Exam Constitutional:      General: He is not in acute distress.    Appearance: Normal appearance. He is obese. He is not ill-appearing, toxic-appearing or diaphoretic.  HENT:     Head: Normocephalic and atraumatic.     Right Ear: External ear normal.     Left Ear: External ear normal.     Mouth/Throat:     Mouth: Mucous membranes are moist.      Pharynx: Oropharynx is clear. No oropharyngeal exudate or posterior oropharyngeal erythema.  Eyes:     General: No scleral icterus.       Right eye: No discharge.        Left eye: No discharge.     Extraocular Movements: Extraocular movements intact.     Conjunctiva/sclera: Conjunctivae normal.     Pupils: Pupils are equal, round, and reactive to light.  Cardiovascular:     Rate and Rhythm: Normal rate and regular rhythm.  Pulmonary:     Effort: Pulmonary effort is normal. No respiratory distress.     Breath sounds: Normal breath sounds.  Musculoskeletal:     Cervical back: No rigidity or tenderness.  Skin:    General: Skin is warm and dry.  Neurological:     Mental Status: He is alert and oriented to person, place, and time.  Psychiatric:        Mood and Affect: Mood normal.        Behavior: Behavior normal.      No results found for any visits on 12/23/21.    The 10-year ASCVD risk score (Arnett DK, et al., 2019) is: 5.5%    Assessment & Plan:   Problem List Items Addressed This  Visit       Endocrine   Type 2 diabetes mellitus with hyperglycemia, without long-term current use of insulin (HCC)   Relevant Medications   metFORMIN (GLUCOPHAGE-XR) 500 MG 24 hr tablet   tirzepatide (MOUNJARO) 5 MG/0.5ML Pen   Other Relevant Orders   Basic metabolic panel   Hemoglobin A1c   Microalbumin / creatinine urine ratio   Urinalysis, Routine w reflex microscopic     Other   Anxiety and Depression   Relevant Medications   FLUoxetine (PROZAC) 20 MG tablet   Morbid obesity due to excess calories (HCC) - Primary   Relevant Medications   metFORMIN (GLUCOPHAGE-XR) 500 MG 24 hr tablet   tirzepatide (MOUNJARO) 5 MG/0.5ML Pen    Return in about 8 weeks (around 02/17/2022).  Aware that GLP1 agonists a/w thyroid c cancers in lab mice. Info given on tirzepatide. Restart metformin and fluoxetine.   Mliss Sax, MD

## 2021-12-24 LAB — URINALYSIS, ROUTINE W REFLEX MICROSCOPIC
Bilirubin Urine: NEGATIVE
Hgb urine dipstick: NEGATIVE
Ketones, ur: NEGATIVE
Leukocytes,Ua: NEGATIVE
Nitrite: NEGATIVE
Specific Gravity, Urine: 1.025 (ref 1.000–1.030)
Total Protein, Urine: NEGATIVE
Urine Glucose: NEGATIVE
Urobilinogen, UA: 0.2 (ref 0.0–1.0)
pH: 6 (ref 5.0–8.0)

## 2021-12-24 LAB — BASIC METABOLIC PANEL
BUN: 9 mg/dL (ref 6–23)
CO2: 26 mEq/L (ref 19–32)
Calcium: 8.8 mg/dL (ref 8.4–10.5)
Chloride: 103 mEq/L (ref 96–112)
Creatinine, Ser: 0.78 mg/dL (ref 0.40–1.50)
GFR: 106.03 mL/min (ref >60.00)
Glucose, Bld: 111 mg/dL — ABNORMAL HIGH (ref 70–99)
Potassium: 3.9 mEq/L (ref 3.5–5.1)
Sodium: 137 mEq/L (ref 135–145)

## 2021-12-24 LAB — MICROALBUMIN / CREATININE URINE RATIO
Creatinine,U: 143.4 mg/dL
Microalb Creat Ratio: 1.6 mg/g (ref 0.0–30.0)
Microalb, Ur: 2.3 mg/dL — ABNORMAL HIGH (ref 0.0–1.9)

## 2021-12-24 LAB — HEMOGLOBIN A1C: Hgb A1c MFr Bld: 6.6 % — ABNORMAL HIGH (ref 4.6–6.5)

## 2021-12-25 ENCOUNTER — Other Ambulatory Visit: Payer: Self-pay

## 2021-12-25 ENCOUNTER — Encounter (HOSPITAL_COMMUNITY): Payer: Self-pay

## 2021-12-25 ENCOUNTER — Emergency Department (HOSPITAL_COMMUNITY): Payer: BC Managed Care – PPO

## 2021-12-25 ENCOUNTER — Emergency Department (HOSPITAL_COMMUNITY)
Admission: EM | Admit: 2021-12-25 | Discharge: 2021-12-25 | Disposition: A | Discharge status: Home / Self Care | Payer: BC Managed Care – PPO | Attending: Emergency Medicine | Admitting: Emergency Medicine

## 2021-12-25 DIAGNOSIS — M25562 Pain in left knee: Secondary | ICD-10-CM | POA: Insufficient documentation

## 2021-12-25 DIAGNOSIS — Z7984 Long term (current) use of oral hypoglycemic drugs: Secondary | ICD-10-CM | POA: Diagnosis not present

## 2021-12-25 DIAGNOSIS — E119 Type 2 diabetes mellitus without complications: Secondary | ICD-10-CM | POA: Diagnosis not present

## 2021-12-25 DIAGNOSIS — M1712 Unilateral primary osteoarthritis, left knee: Secondary | ICD-10-CM | POA: Diagnosis not present

## 2021-12-25 MED ORDER — KETOROLAC TROMETHAMINE 30 MG/ML IJ SOLN
30.0000 mg | Freq: Once | INTRAMUSCULAR | Status: AC
  Administered 2021-12-25: 30 mg via INTRAMUSCULAR
  Filled 2021-12-25: qty 1

## 2021-12-25 NOTE — ED Triage Notes (Signed)
Pt arrived POV from home c/o left knee pain that started on Friday. Pt states he felt a pop in his knee and now it is swollen and he is unable to bare weight on it.

## 2021-12-25 NOTE — Discharge Instructions (Signed)
Your xray today did not reveal any acute fracture or dislocation. It did show some degenerative changes that have occurred over time. I think you would benefit from follow up with Orthopedics and have them evaluate you for consideration of MRI. Please call the number provided to you to schedule an appointment.  You can treat your pain by alternating Ibuprofen and Tylenol every 6 hours over the next several days. I also recommend rest, ice, compression, and elevation as able. We have provided you with a knee immobilizer that you should try to use when weight bearing. Please have orthopedics clear you from this.

## 2021-12-25 NOTE — ED Provider Notes (Signed)
[facility] EMERGENCY DEPARTMENT Provider Note   CSN: [id number] Arrival date & time: 12/25/21  0740     History PMH:  Today, [name], [name] Patient presents the emergency department with left knee pain.  He says on Friday he was in the swimming pool when he squatted down in the shallow section and felt a pop in his left knee.  He had some pain at that time but was continuing to walk on it.  Notes the pain got improved getting progressively worse throughout the weekend.  He says that today he got up and was walking when he felt a sudden pop in the back part of his knee and had sudden onset of pain.  He has been unable to bear weight on it since then.  He thinks that is more swollen than his right leg.  He denies any history of prior knee surgeries or problems with this knee.  He denies loss of sensation.   Knee Pain      Home Medications Prior to Admission medications   Medication Sig Start Date End Date Taking? Authorizing Provider  FLUoxetine (PROZAC) 20 MG tablet Take 1 tablet (20 mg total) by mouth daily. 12/23/21   Mliss Sax, MD  lansoprazole (PREVACID) 30 MG capsule TAKE 1 CAPSULE BY MOUTH EVERY DAY 11/11/21   Mliss Sax, MD  metFORMIN (GLUCOPHAGE-XR) 500 MG 24 hr tablet TAKE 1 TABLET BY MOUTH EVERY DAY WITH BREAKFAST 12/23/21   Mliss Sax, MD  Multiple Vitamins-Minerals (MULTIVITAL) tablet Take 1 tablet by mouth daily.     [provider]  tirzepatide Greggory Keen) 5 MG/0.5ML Pen Inject 5 mg into the skin once a week. Please instruct and administer in appropriate needles 12/23/21   Mliss Sax, MD      Allergies    Patient has no known allergies.    Review of Systems   Review of Systems  Musculoskeletal:  Positive for arthralgias and joint swelling.  All other systems reviewed and are  negative.   Physical Exam Updated Vital Signs BP 113/63   Pulse 61   Temp 97.9 F (36.6 C) (Oral)   Resp 20   Ht 5\' 11"  (1.803 m)   Wt (!) 165.6 kg   SpO2 98%   BMI 50.91 kg/m  Physical Exam Vitals and nursing note reviewed.  Constitutional:      General: He is not in acute distress.    Appearance: Normal appearance. He is well-developed. He is not ill-appearing, toxic-appearing or diaphoretic.  HENT:     Head: Normocephalic and atraumatic.     Nose: No nasal deformity.     Mouth/Throat:     Lips: Pink. No lesions.  Eyes:     General: Gaze aligned appropriately. No scleral icterus.       Right eye: No discharge.        Left eye: No discharge.     Conjunctiva/sclera: Conjunctivae normal.     Right eye: Right conjunctiva is not injected. No exudate or hemorrhage.    Left eye: Left conjunctiva is not injected. No exudate or hemorrhage. Pulmonary:     Effort: Pulmonary effort is normal. No respiratory distress.  Musculoskeletal:     Comments: Left knee with mild swelling.  No joint effusion.  No erythema.  No reproducible tenderness.  Patient does have pain with  active range of motion of extension and flexion but is able to do this.  Most of his pain is in the posterior part of his knee.  2+ pedal pulses bilaterally as well as normal sensation bilaterally.  Skin:    General: Skin is warm and dry.  Neurological:     Mental Status: He is alert and oriented to person, place, and time.  Psychiatric:        Mood and Affect: Mood normal.        Speech: Speech normal.        Behavior: Behavior normal. Behavior is cooperative.     ED Results / Procedures / Treatments   Labs (all labs ordered are listed, but only abnormal results are displayed) Labs Reviewed - No data to display  EKG None  Radiology DG Knee Complete 4 Views Left  Result Date: 12/25/2021 CLINICAL DATA:  Knee pain since Friday. EXAM: LEFT KNEE - COMPLETE 4+ VIEW COMPARISON:  None Available. FINDINGS:  Mild/early tricompartmental degenerative changes for age but no acute fracture, osteochondral lesion or obvious joint effusion. IMPRESSION: Mild/early tricompartmental degenerative changes but no acute bony findings or joint effusion. Electronically Signed   By: Rudie Meyer M.D.   On: 12/25/2021 08:16    Procedures Procedures   Medications Ordered in ED Medications  ketorolac (TORADOL) 30 MG/ML injection 30 mg (30 mg Intramuscular Given 12/25/21 1046)    ED Course/ Medical Decision Making/ A&P                           Medical Decision Making Amount and/or Complexity of Data Reviewed Radiology: ordered.  Risk Prescription drug management.   Patient presents with left knee pain after an injury he sustained on Friday.  Symptoms have gotten progressively worse throughout this week to the point where he is now not weightbearing.  He is stable vitals and is afebrile here.  He has mild swelling of the left knee with pain with active range of motion.  No evidence on exam of patellar tendon rupture. He is neurovascularly intact.  I have low suspicion for DVT.  We did obtain an x-ray which revealed mild or early tricompartmental degenerative changes but no acute fracture, dislocation, or joint effusion.  In the setting of a traumatic knee injury, I recommend knee immobilizer when ambulating or as needed, RICE treatment, and f/u with orthopedics for consideration of MRI to look for soft tissue injury. No further imaging or workup is needed today.   Final Clinical Impression(s) / ED Diagnoses Final diagnoses:  Acute pain of left knee    Rx / DC Orders ED Discharge Orders     None         Claudie Leach, PA-C 12/25/21 1321    Terrilee Files, MD 12/25/21 1754

## 2021-12-26 DIAGNOSIS — M25562 Pain in left knee: Secondary | ICD-10-CM | POA: Diagnosis not present

## 2021-12-31 ENCOUNTER — Telehealth: Payer: Self-pay | Admitting: Family Medicine

## 2021-12-31 NOTE — Telephone Encounter (Signed)
Pt called and stated that the tirzepatide [facility]) 5 MG/0.5ML Pen [[id number]]   Was discontinued  and need a substitute one for it

## 2022-01-01 DIAGNOSIS — M25562 Pain in left knee: Secondary | ICD-10-CM | POA: Diagnosis not present

## 2022-01-03 DIAGNOSIS — G4733 Obstructive sleep apnea (adult) (pediatric): Secondary | ICD-10-CM | POA: Diagnosis not present

## 2022-01-03 DIAGNOSIS — E119 Type 2 diabetes mellitus without complications: Secondary | ICD-10-CM | POA: Diagnosis not present

## 2022-01-03 DIAGNOSIS — M25562 Pain in left knee: Secondary | ICD-10-CM | POA: Diagnosis not present

## 2022-01-06 NOTE — Telephone Encounter (Signed)
Called pharmacy and they ar on back order right now and will check with other [facility] to see if they have it and then reach out to the patient. Dm/cma

## 2022-01-09 DIAGNOSIS — G4733 Obstructive sleep apnea (adult) (pediatric): Secondary | ICD-10-CM | POA: Diagnosis not present

## 2022-01-14 DIAGNOSIS — M25662 Stiffness of left knee, not elsewhere classified: Secondary | ICD-10-CM | POA: Diagnosis not present

## 2022-01-14 DIAGNOSIS — M25562 Pain in left knee: Secondary | ICD-10-CM | POA: Diagnosis not present

## 2022-01-17 DIAGNOSIS — M25562 Pain in left knee: Secondary | ICD-10-CM | POA: Diagnosis not present

## 2022-01-17 NOTE — Telephone Encounter (Signed)
error 

## 2022-02-21 ENCOUNTER — Ambulatory Visit: Payer: BC Managed Care – PPO | Admitting: Family Medicine

## 2022-02-24 ENCOUNTER — Ambulatory Visit: Payer: BC Managed Care – PPO | Admitting: Family Medicine

## 2022-02-24 ENCOUNTER — Encounter: Payer: Self-pay | Admitting: Family Medicine

## 2022-02-24 ENCOUNTER — Telehealth: Payer: Self-pay | Admitting: Family Medicine

## 2022-02-24 NOTE — Telephone Encounter (Signed)
2nd no show w/iin 12 months, no fee for Medicaid 2ndary, final warning letter sent

## 2022-02-24 NOTE — Telephone Encounter (Signed)
8.21.23 no show letter sent 

## 2022-02-26 NOTE — Telephone Encounter (Signed)
Thank you, I've made a note of that.

## 2022-03-07 DIAGNOSIS — G4733 Obstructive sleep apnea (adult) (pediatric): Secondary | ICD-10-CM | POA: Diagnosis not present

## 2022-04-10 DIAGNOSIS — G4733 Obstructive sleep apnea (adult) (pediatric): Secondary | ICD-10-CM | POA: Diagnosis not present

## 2022-04-11 DIAGNOSIS — G4733 Obstructive sleep apnea (adult) (pediatric): Secondary | ICD-10-CM | POA: Diagnosis not present

## 2022-04-18 ENCOUNTER — Other Ambulatory Visit: Payer: Self-pay | Admitting: Family Medicine

## 2022-04-18 DIAGNOSIS — F39 Unspecified mood [affective] disorder: Secondary | ICD-10-CM

## 2022-05-02 ENCOUNTER — Other Ambulatory Visit: Payer: Self-pay | Admitting: Family Medicine

## 2022-05-02 DIAGNOSIS — F39 Unspecified mood [affective] disorder: Secondary | ICD-10-CM

## 2022-05-11 DIAGNOSIS — G4733 Obstructive sleep apnea (adult) (pediatric): Secondary | ICD-10-CM | POA: Diagnosis not present

## 2022-05-13 ENCOUNTER — Other Ambulatory Visit: Payer: Self-pay | Admitting: Family Medicine

## 2022-05-13 DIAGNOSIS — F39 Unspecified mood [affective] disorder: Secondary | ICD-10-CM

## 2022-06-10 DIAGNOSIS — G4733 Obstructive sleep apnea (adult) (pediatric): Secondary | ICD-10-CM | POA: Diagnosis not present

## 2022-06-19 ENCOUNTER — Other Ambulatory Visit: Payer: Self-pay | Admitting: Family Medicine

## 2022-06-19 DIAGNOSIS — E1165 Type 2 diabetes mellitus with hyperglycemia: Secondary | ICD-10-CM

## 2022-07-09 DIAGNOSIS — G4733 Obstructive sleep apnea (adult) (pediatric): Secondary | ICD-10-CM | POA: Diagnosis not present

## 2022-07-12 DIAGNOSIS — G4733 Obstructive sleep apnea (adult) (pediatric): Secondary | ICD-10-CM | POA: Diagnosis not present

## 2022-08-09 DIAGNOSIS — G4733 Obstructive sleep apnea (adult) (pediatric): Secondary | ICD-10-CM | POA: Diagnosis not present

## 2022-08-12 ENCOUNTER — Other Ambulatory Visit: Payer: Self-pay | Admitting: Family Medicine

## 2022-08-12 DIAGNOSIS — F39 Unspecified mood [affective] disorder: Secondary | ICD-10-CM

## 2022-09-07 DIAGNOSIS — G4733 Obstructive sleep apnea (adult) (pediatric): Secondary | ICD-10-CM | POA: Diagnosis not present

## 2022-10-05 ENCOUNTER — Other Ambulatory Visit: Payer: Self-pay | Admitting: Family Medicine

## 2022-10-05 DIAGNOSIS — K219 Gastro-esophageal reflux disease without esophagitis: Secondary | ICD-10-CM

## 2022-10-05 DIAGNOSIS — E1165 Type 2 diabetes mellitus with hyperglycemia: Secondary | ICD-10-CM

## 2022-10-07 DIAGNOSIS — G4733 Obstructive sleep apnea (adult) (pediatric): Secondary | ICD-10-CM | POA: Diagnosis not present

## 2022-10-21 ENCOUNTER — Ambulatory Visit (INDEPENDENT_AMBULATORY_CARE_PROVIDER_SITE_OTHER): Payer: BC Managed Care – PPO | Admitting: Family Medicine

## 2022-10-21 ENCOUNTER — Encounter: Payer: Self-pay | Admitting: Family Medicine

## 2022-10-21 VITALS — BP 122/80 | HR 62 | Temp 98.1°F | Resp 18 | Ht 71.0 in | Wt 234.4 lb

## 2022-10-21 DIAGNOSIS — F39 Unspecified mood [affective] disorder: Secondary | ICD-10-CM

## 2022-10-21 DIAGNOSIS — E1165 Type 2 diabetes mellitus with hyperglycemia: Secondary | ICD-10-CM | POA: Diagnosis not present

## 2022-10-21 DIAGNOSIS — G4733 Obstructive sleep apnea (adult) (pediatric): Secondary | ICD-10-CM

## 2022-10-21 DIAGNOSIS — Z1211 Encounter for screening for malignant neoplasm of colon: Secondary | ICD-10-CM

## 2022-10-21 NOTE — Progress Notes (Signed)
Established Patient Office Visit   Subjective:  Patient ID: Paul Cawley., male    DOB: [birth year]  Age: 47 y.o. MRN: [id number]  Chief Complaint  Patient presents with   Annual Exam    Concerns/ questions: none Eye exam: overdue x 2 years Hep C screen and A1C due today. Colonoscopy: none yet     HPI Encounter Diagnoses  Name Primary?   Type 2 diabetes mellitus with hyperglycemia, without long-term current use of insulin Yes   Morbid obesity due to excess calories    Obstructive sleep apnea    Anxiety and Depression    Screening for colon cancer    Here today for follow-up of above.  Doing well.  Has lost 43 pounds and tirzepatide has been a great help.  It works for him.  Decreased alcohol intake with it.  He is thinking about joining the gym.  Continues with CPAP machine.  It works well for him and he uses it.  Continues fluoxetine for depression.  Single parent.  Lives with his 2 sons, 1 of which is a special needs child.   Review of Systems  Constitutional: Negative.   HENT: Negative.    Eyes:  Negative for blurred vision, discharge and redness.  Respiratory: Negative.    Cardiovascular: Negative.   Gastrointestinal:  Negative for abdominal pain.  Genitourinary: Negative.   Musculoskeletal: Negative.  Negative for myalgias.  Skin:  Negative for rash.  Neurological:  Negative for tingling, loss of consciousness and weakness.  Endo/Heme/Allergies:  Negative for polydipsia.     Current Outpatient Medications:    FLUoxetine (PROZAC) 20 MG tablet, TAKE 1 TABLET BY MOUTH EVERY DAY, Disp: 90 tablet, Rfl: 0   lansoprazole (PREVACID) 30 MG capsule, TAKE 1 CAPSULE BY MOUTH EVERY DAY FOR GERD, Disp: 90 capsule, Rfl: 0   metFORMIN (GLUCOPHAGE-XR) 500 MG 24 hr tablet, TAKE 1 TABLET BY MOUTH EVERY DAY WITH BREAKFAST, Disp: 90 tablet, Rfl: 1   Multiple Vitamins-Minerals (MULTIVITAL) tablet, Take 1 tablet by mouth daily. , Disp: , Rfl:    tirzepatide (MOUNJARO) 5 MG/0.5ML  Pen, INJECT 5 MG INTO THE SKIN ONCE A WEEK. PLEASE INSTRUCT AND ADMINISTER IN APPROPRIATE NEEDLES, Disp: 6 mL, Rfl: 0   Objective:     BP 122/80 (BP Location: Left Arm, Patient Position: Sitting, Cuff Size: Large)   Pulse 62   Temp 98.1 F (36.7 C) (Oral)   Resp 18   Ht  (1.803 m)   Wt 234 lb 6.4 oz (106.3 kg)   SpO2 98%   BMI 32.69 kg/m    Physical Exam Constitutional:      General: He is not in acute distress.    Appearance: Normal appearance. He is not ill-appearing, toxic-appearing or diaphoretic.  HENT:     Head: Normocephalic and atraumatic.     Right Ear: External ear normal.     Left Ear: External ear normal.     Mouth/Throat:     Mouth: Mucous membranes are moist.     Pharynx: Oropharynx is clear. No oropharyngeal exudate or posterior oropharyngeal erythema.  Eyes:     General: No scleral icterus.       Right eye: No discharge.        Left eye: No discharge.     Extraocular Movements: Extraocular movements intact.     Conjunctiva/sclera: Conjunctivae normal.  Cardiovascular:     Rate and Rhythm: Normal rate and regular rhythm.  Pulmonary:     Effort: Pulmonary  effort is normal. No respiratory distress.     Breath sounds: Normal breath sounds.  Musculoskeletal:     Cervical back: No rigidity or tenderness.  Skin:    General: Skin is warm and dry.  Neurological:     Mental Status: He is alert and oriented to person, place, and time.  Psychiatric:        Mood and Affect: Mood normal.        Behavior: Behavior normal.      No results found for any visits on 10/21/22.    The 10-year ASCVD risk score (Arnett DK, et al., 2019) is: 6.4%    Assessment & Plan:   Type 2 diabetes mellitus with hyperglycemia, without long-term current use of insulin -     Basic metabolic panel -     Hemoglobin A1c  Morbid obesity due to excess calories  Obstructive sleep apnea  Anxiety and Depression  Screening for colon cancer -     Ambulatory referral to  Gastroenterology    Return in about 6 months (around 04/22/2023).  Continue current medications.  Continue weight loss journey.  Mliss Sax, MD

## 2022-10-22 LAB — BASIC METABOLIC PANEL
BUN: 10 mg/dL (ref 6–23)
CO2: 26 mEq/L (ref 19–32)
Calcium: 9.1 mg/dL (ref 8.4–10.5)
Chloride: 105 mEq/L (ref 96–112)
Creatinine, Ser: 0.86 mg/dL (ref 0.40–1.50)
GFR: 102.35 mL/min (ref >60.00)
Glucose, Bld: 87 mg/dL (ref 70–99)
Potassium: 4.1 mEq/L (ref 3.5–5.1)
Sodium: 140 mEq/L (ref 135–145)

## 2022-10-22 LAB — HEMOGLOBIN A1C: Hgb A1c MFr Bld: 5.4 % (ref 4.6–6.5)

## 2022-11-06 DIAGNOSIS — G4733 Obstructive sleep apnea (adult) (pediatric): Secondary | ICD-10-CM | POA: Diagnosis not present

## 2022-11-15 ENCOUNTER — Other Ambulatory Visit: Payer: Self-pay | Admitting: Family Medicine

## 2022-11-15 DIAGNOSIS — F39 Unspecified mood [affective] disorder: Secondary | ICD-10-CM

## 2022-11-28 ENCOUNTER — Other Ambulatory Visit: Payer: Self-pay | Admitting: Family Medicine

## 2022-11-28 DIAGNOSIS — E1165 Type 2 diabetes mellitus with hyperglycemia: Secondary | ICD-10-CM

## 2022-12-05 ENCOUNTER — Other Ambulatory Visit: Payer: Self-pay | Admitting: Family Medicine

## 2022-12-05 DIAGNOSIS — K219 Gastro-esophageal reflux disease without esophagitis: Secondary | ICD-10-CM

## 2022-12-07 DIAGNOSIS — G4733 Obstructive sleep apnea (adult) (pediatric): Secondary | ICD-10-CM | POA: Diagnosis not present

## 2023-01-06 ENCOUNTER — Other Ambulatory Visit: Payer: Self-pay | Admitting: Family Medicine

## 2023-01-06 DIAGNOSIS — E1165 Type 2 diabetes mellitus with hyperglycemia: Secondary | ICD-10-CM

## 2023-01-18 DIAGNOSIS — G4733 Obstructive sleep apnea (adult) (pediatric): Secondary | ICD-10-CM | POA: Diagnosis not present

## 2023-02-18 DIAGNOSIS — G4733 Obstructive sleep apnea (adult) (pediatric): Secondary | ICD-10-CM | POA: Diagnosis not present

## 2023-03-21 DIAGNOSIS — G4733 Obstructive sleep apnea (adult) (pediatric): Secondary | ICD-10-CM | POA: Diagnosis not present

## 2023-04-06 ENCOUNTER — Encounter: Payer: Self-pay | Admitting: Family Medicine

## 2023-04-06 ENCOUNTER — Ambulatory Visit (INDEPENDENT_AMBULATORY_CARE_PROVIDER_SITE_OTHER): Payer: BC Managed Care – PPO | Admitting: Family Medicine

## 2023-04-06 VITALS — BP 136/84 | HR 65 | Temp 97.8°F | Ht 71.0 in | Wt 317.8 lb

## 2023-04-06 DIAGNOSIS — R42 Dizziness and giddiness: Secondary | ICD-10-CM | POA: Diagnosis not present

## 2023-04-06 DIAGNOSIS — Z1152 Encounter for screening for COVID-19: Secondary | ICD-10-CM | POA: Diagnosis not present

## 2023-04-06 DIAGNOSIS — J069 Acute upper respiratory infection, unspecified: Secondary | ICD-10-CM | POA: Insufficient documentation

## 2023-04-06 LAB — POC COVID19 BINAXNOW: SARS Coronavirus 2 Ag: NEGATIVE

## 2023-04-06 NOTE — Progress Notes (Signed)
Established Patient Office Visit   Subjective:  Patient ID: Paul Beever., male    DOB: [birth year]  Age: 48 y.o. MRN: [id number]  Chief Complaint  Patient presents with   Sinus Problem    Nasal congestions with dizziness x 2 days.     Sinus Problem Associated symptoms include chills. Pertinent negatives include no congestion, coughing, diaphoresis, headaches, shortness of breath or sore throat.   Encounter Diagnoses  Name Primary?   Upper respiratory tract infection, unspecified type Yes   Lightheadedness    1 to 2-day history of lightheadedness without a spinning sensation associated with a pressure feeling in his cheekbones.  He denies fevers but did feel a little chilled yesterday.  Denies nasal congestion postnasal drip or rhinorrhea.  There has been no cough or difficulty breathing.  Denies new myalgias or arthralgias.  Daughter is sick at home with something similar.   Review of Systems  Constitutional:  Positive for chills. Negative for diaphoresis and fever.  HENT:  Positive for sinus pain. Negative for congestion and sore throat.   Eyes:  Negative for blurred vision, discharge and redness.  Respiratory: Negative.  Negative for cough, shortness of breath and wheezing.   Cardiovascular: Negative.   Gastrointestinal:  Negative for abdominal pain.  Genitourinary: Negative.   Musculoskeletal: Negative.  Negative for joint pain and myalgias.  Skin:  Negative for rash.  Neurological:  Positive for dizziness. Negative for tingling, loss of consciousness, weakness and headaches.  Endo/Heme/Allergies:  Negative for polydipsia.     Current Outpatient Medications:    FLUoxetine (PROZAC) 20 MG tablet, TAKE 1 TABLET BY MOUTH EVERY DAY, Disp: 90 tablet, Rfl: 1   lansoprazole (PREVACID) 30 MG capsule, TAKE 1 CAPSULE BY MOUTH EVERY DAY FOR GERD, Disp: 90 capsule, Rfl: 1   metFORMIN (GLUCOPHAGE-XR) 500 MG 24 hr tablet, TAKE 1 TABLET BY MOUTH EVERY DAY WITH BREAKFAST, Disp: 90  tablet, Rfl: 1   Multiple Vitamins-Minerals (MULTIVITAL) tablet, Take 1 tablet by mouth daily. , Disp: , Rfl:    tirzepatide (MOUNJARO) 5 MG/0.5ML Pen, INJECT 5 MG INTO THE SKIN ONCE A WEEK. PLEASE INSTRUCT AND ADMINISTER IN APPROPRIATE NEEDLES, Disp: 6 mL, Rfl: 1   Objective:     BP 136/84   Pulse 65   Temp 97.8 F (36.6 C)   Ht 5\' 11"  (1.803 m)   Wt (!) 317 lb 12.8 oz (144.2 kg)   SpO2 99%   BMI 44.32 kg/m    Physical Exam Constitutional:      General: He is not in acute distress.    Appearance: Normal appearance. He is not ill-appearing, toxic-appearing or diaphoretic.  HENT:     Head: Normocephalic and atraumatic.      Right Ear: Tympanic membrane, ear canal and external ear normal.     Left Ear: Tympanic membrane, ear canal and external ear normal.     Mouth/Throat:     Mouth: Mucous membranes are moist.     Pharynx: Oropharynx is clear. No oropharyngeal exudate or posterior oropharyngeal erythema.  Eyes:     General: No scleral icterus.       Right eye: No discharge.        Left eye: No discharge.     Extraocular Movements: Extraocular movements intact.     Conjunctiva/sclera: Conjunctivae normal.     Pupils: Pupils are equal, round, and reactive to light.  Cardiovascular:     Rate and Rhythm: Normal rate and regular rhythm.  Pulmonary:  Effort: Pulmonary effort is normal. No respiratory distress.     Breath sounds: Normal breath sounds. No wheezing, rhonchi or rales.  Abdominal:     General: Bowel sounds are normal.     Tenderness: There is no abdominal tenderness. There is no guarding.  Musculoskeletal:     Cervical back: No rigidity or tenderness.  Skin:    General: Skin is warm and dry.  Neurological:     Mental Status: He is alert and oriented to person, place, and time.  Psychiatric:        Mood and Affect: Mood normal.        Behavior: Behavior normal.      No results found for any visits on 04/06/23.    The ASCVD Risk score (Arnett DK, et  al., 2019) failed to calculate for the following reasons:   Cannot find a previous HDL lab   Cannot find a previous total cholesterol lab    Assessment & Plan:   Upper respiratory tract infection, unspecified type  Lightheadedness    Return in about 1 week (around 04/13/2023), or if symptoms worsen or fail to improve.  Increase fluid intake.  May take DayQuil or NyQuil as needed.  Out of work through Wednesday.  Information was given on dizziness.   Mliss Sax, MD

## 2023-04-22 ENCOUNTER — Ambulatory Visit (INDEPENDENT_AMBULATORY_CARE_PROVIDER_SITE_OTHER): Payer: BC Managed Care – PPO | Admitting: Family Medicine

## 2023-04-22 ENCOUNTER — Encounter: Payer: Self-pay | Admitting: Family Medicine

## 2023-04-22 VITALS — BP 130/80 | HR 86 | Temp 97.6°F | Wt 313.6 lb

## 2023-04-22 DIAGNOSIS — R0981 Nasal congestion: Secondary | ICD-10-CM | POA: Diagnosis not present

## 2023-04-22 DIAGNOSIS — R42 Dizziness and giddiness: Secondary | ICD-10-CM

## 2023-04-22 MED ORDER — FLUTICASONE PROPIONATE 50 MCG/ACT NA SUSP: 2.0000 | Freq: Every day | NASAL | 6 refills | Status: DC

## 2023-04-22 MED ORDER — MECLIZINE HCL 25 MG PO TABS: 12.5000 mg | ORAL_TABLET | Freq: Three times a day (TID) | ORAL | 0 refills | Status: AC | PRN

## 2023-04-22 NOTE — Patient Instructions (Signed)
Begin using Flonase, two sprays in each nostril daily, to help with nasal congestion and sinus pressure. For dizziness, try Meclizine 12.5 to 25 mg every 8 hours as needed, but be cautious of potential drowsiness. Maintain good hydration to avoid dehydration, which can contribute to lightheadedness. If experiencing severe vertigo, try the Epley maneuver at home with the help of a partner. Monitor for any worsening symptoms such as chest pain, shortness of breath, blurred vision, or weakness, and seek medical attention if they occur.

## 2023-04-22 NOTE — Progress Notes (Signed)
Assessment/Plan:   Problem List Items Addressed This Visit       Respiratory   Sinus congestion    Likely allergic rhinitis  Prescribe Flonase (fluticasone propionate) nasal spray, 50 mcg per spray, 2 sprays in each nostril once daily. Recommend over-the-counter allergy medications such as loratadine or cetirizine.      Relevant Medications   fluticasone (FLONASE) 50 MCG/ACT nasal spray     Other   Dizziness - Primary     Likely benign positional vertigo, given the response to Dramamine. Chronic Condition Status: Intermittent, self-resolving episodes. Plan: Recommend Epley maneuver to be performed at home. Advise the use of meclizine (Antivert), 12.5 mg to 25 mg every 8 hours as needed, especially if symptoms become severely disrupting. Ensure adequate hydration. Monitor for any exacerbation or new symptoms (chest pain, shortness of breath, blurred vision, weakness, confusion).      Relevant Medications   meclizine (ANTIVERT) 25 MG tablet    There are no discontinued medications.  Return if symptoms worsen or fail to improve.    Subjective:   Encounter date: 04/22/2023  Paul Becton. is a 47 y.o. male who has Hyperlipemia; Gastroesophageal reflux disease; Anxiety and Depression; Obstructive sleep apnea; Morbid obesity due to excess calories (HCC); Healthcare maintenance; Type 2 diabetes mellitus with hyperglycemia, without long-term current use of insulin (HCC); COVID-19; Post-viral cough syndrome; Upper respiratory tract infection; Dizziness; and Sinus congestion on their problem list..   He  has a past medical history of Anxiety and depression, Chicken pox, Obesity, and Plantar fasciitis..   Chief Complaint: Intermittent dizziness and sinus congestion.  History of Present Illness: Patient presented with intermittent dizziness and sinus congestion for the past 2 weeks. They had similar symptoms around the end of September, including lightheadedness, thought  to be related to dehydration. The next day, symptoms resolved but returned intermittently, described as a lightheaded feeling without losing coordination or balance.  On Sunday, while fishing, the lightheadedness reoccurred. The patient reports sneezing more and having a runny nose over the past few days. The nasal congestion has persisted on and off, with the patient feeling occasional sinus pressure, particularly under the eyes. The patient occasionally experiences chills but denies any recent episodes. No fever, nausea, vomiting, chest pain, shortness of breath, blurry vision, confusion, ear pressure, or headaches are reported. They note that anxiety has been elevated over the past couple of weeks.  The patient reported taking Dramamine, which seemed to help with dizziness, suggesting the dizziness might be related more to vertigo than to sinus congestion.  Review of Systems:  Constitutional: Intermittent chills, anxiety elevated. Eyes: No blurry vision or double vision. ENT: Sneezing, nasal congestion, and intermittent sinus pressure. Cardiovascular: No chest pain. Respiratory: No shortness of breath. Gastrointestinal: No nausea or vomiting. Neurological: Intermittent lightheadedness, no loss of coordination or balance. Psychiatric: Elevated anxiety. Other systems: Otherwise negative.    Past Surgical History:  Procedure Laterality Date   TONSILLECTOMY     age 27     Outpatient Medications Prior to Visit  Medication Sig Dispense Refill   FLUoxetine (PROZAC) 20 MG tablet TAKE 1 TABLET BY MOUTH EVERY DAY 90 tablet 1   lansoprazole (PREVACID) 30 MG capsule TAKE 1 CAPSULE BY MOUTH EVERY DAY FOR GERD 90 capsule 1   metFORMIN (GLUCOPHAGE-XR) 500 MG 24 hr tablet TAKE 1 TABLET BY MOUTH EVERY DAY WITH BREAKFAST 90 tablet 1   Multiple Vitamins-Minerals (MULTIVITAL) tablet Take 1 tablet by mouth daily.  tirzepatide (MOUNJARO) 5 MG/0.5ML Pen INJECT 5 MG INTO THE SKIN ONCE A WEEK. PLEASE  INSTRUCT AND ADMINISTER IN APPROPRIATE NEEDLES 6 mL 1   No facility-administered medications prior to visit.    Family History  Problem Relation Age of Onset   Hypertension Mother    Diabetes Mother    High Cholesterol Mother    Healthy Father    Healthy Sister    Healthy Brother    Healthy Brother    Healthy Brother    Colon cancer Other    Prostate cancer Other     Social History   Socioeconomic History   Marital status: Single    Spouse name: Not on file   Number of children: Not on file   Years of education: Not on file   Highest education level: Not on file  Occupational History   Not on file  Tobacco Use   Smoking status: Never   Smokeless tobacco: Never  Vaping Use   Vaping status: Never Used  Substance and Sexual Activity   Alcohol use: Yes    Alcohol/week: 1.0 standard drink of alcohol    Types: 1 Cans of beer per week    Comment: social   Drug use: No   Sexual activity: Not Currently  Other Topics Concern   Not on file  Social History Narrative   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 Situation: lives with kids (12, 8 and 6 in 2014)      Spiritual Beliefs:      Lifestyle: exercises daily - gym 5 days, CV, swimming, weights; diet is improving - son has glut-1 and on adkins type diet, pt has lost 30 lbs in last few months            Social Determinants of Corporate investment banker Strain: Not on file  Food Insecurity: Not on file  Transportation Needs: Not on file  Physical Activity: Not on file  Stress: Not on file  Social Connections: Not on file  Intimate Partner Violence: Not on file                                                                                                  Objective:  Physical Exam: BP 130/80 (BP Location: Left Arm, Patient Position: Sitting, Cuff Size: Large)   Pulse 86   Temp 97.6 F (36.4 C) (Temporal)   Wt (!) 313 lb 9.6 oz (142.2 kg)   SpO2 99%   BMI 43.74 kg/m     Orthostatic VS for the past 72 hrs (Last 3 readings):  Orthostatic BP Patient Position BP Location Cuff Size Orthostatic Pulse  04/22/23 1444 144/86 Lying right side Left Arm Large 67  04/22/23 1443 138/84 Standing Left Arm Large 70  04/22/23 1442 138/82 Sitting Left Arm -- 69  04/22/23 1418 -- Sitting Left Arm Large --     Physical Exam Constitutional:      Appearance: Normal appearance.  HENT:     Head: Normocephalic and atraumatic.     Right Ear: Hearing and  ear canal normal.     Left Ear: Hearing, tympanic membrane and ear canal normal.     Nose: Congestion and rhinorrhea present. Rhinorrhea is clear.     Right Sinus: No maxillary sinus tenderness or frontal sinus tenderness.     Left Sinus: No maxillary sinus tenderness or frontal sinus tenderness.     Mouth/Throat:     Mouth: Mucous membranes are moist.     Dentition: Normal dentition.     Pharynx: Oropharynx is clear.  Eyes:     General: Lids are normal. Lids are everted, no foreign bodies appreciated. Vision grossly intact. Gaze aligned appropriately. No scleral icterus.       Right eye: No discharge.        Left eye: No discharge.     Extraocular Movements: Extraocular movements intact.     Conjunctiva/sclera: Conjunctivae normal.     Pupils: Pupils are equal, round, and reactive to light.     Visual Fields: Right eye visual fields normal and left eye visual fields normal.  Cardiovascular:     Rate and Rhythm: Normal rate and regular rhythm.     Heart sounds: Normal heart sounds.  Pulmonary:     Effort: Pulmonary effort is normal.     Breath sounds: Normal breath sounds.  Abdominal:     Palpations: Abdomen is soft.     Tenderness: There is no abdominal tenderness.  Skin:    General: Skin is warm.     Findings: No rash.  Neurological:     General: No focal deficit present.     Mental Status: He is alert and oriented to person, place, and time.     Cranial Nerves: Cranial nerves 2-12 are intact. No cranial  nerve deficit.     Sensory: Sensation is intact.     Motor: Motor function is intact.     Coordination: Coordination is intact.     Gait: Gait normal.  Psychiatric:        Mood and Affect: Mood normal.        Behavior: Behavior normal.        Thought Content: Thought content normal.        Judgment: Judgment normal.     No results found.  Recent Results (from the past 2160 hour(s))  POC COVID-19 [employer]Now     Status: None   Collection Time: 04/06/23 10:55 AM  Result Value Ref Range   SARS Coronavirus 2 Ag Negative Negative        Garner Nash, MD, MS

## 2023-04-24 DIAGNOSIS — R0981 Nasal congestion: Secondary | ICD-10-CM | POA: Insufficient documentation

## 2023-04-24 NOTE — Assessment & Plan Note (Signed)
Likely allergic rhinitis  Prescribe Flonase (fluticasone propionate) nasal spray, 50 mcg per spray, 2 sprays in each nostril once daily. Recommend over-the-counter allergy medications such as loratadine or cetirizine.

## 2023-04-24 NOTE — Assessment & Plan Note (Signed)
Likely benign positional vertigo, given the response to Dramamine. Chronic Condition Status: Intermittent, self-resolving episodes. Plan: Recommend Epley maneuver to be performed at home. Advise the use of meclizine (Antivert), 12.5 mg to 25 mg every 8 hours as needed, especially if symptoms become severely disrupting. Ensure adequate hydration. Monitor for any exacerbation or new symptoms (chest pain, shortness of breath, blurred vision, weakness, confusion).

## 2023-04-27 ENCOUNTER — Telehealth: Payer: Self-pay | Admitting: Family Medicine

## 2023-04-27 ENCOUNTER — Ambulatory Visit: Payer: BC Managed Care – PPO | Admitting: Family Medicine

## 2023-04-27 NOTE — Telephone Encounter (Signed)
Pt texted No to an OV with Dr Doreene Burke for 04/27/23, I wanted to confirm with pt before  I cancelled it.

## 2023-05-04 DIAGNOSIS — G4733 Obstructive sleep apnea (adult) (pediatric): Secondary | ICD-10-CM | POA: Diagnosis not present

## 2023-05-29 ENCOUNTER — Other Ambulatory Visit: Payer: Self-pay | Admitting: Family Medicine

## 2023-05-29 DIAGNOSIS — E1165 Type 2 diabetes mellitus with hyperglycemia: Secondary | ICD-10-CM

## 2023-05-29 DIAGNOSIS — F39 Unspecified mood [affective] disorder: Secondary | ICD-10-CM

## 2023-06-04 DIAGNOSIS — G4733 Obstructive sleep apnea (adult) (pediatric): Secondary | ICD-10-CM | POA: Diagnosis not present

## 2023-06-30 ENCOUNTER — Other Ambulatory Visit (HOSPITAL_COMMUNITY)
Admission: RE | Admit: 2023-06-30 | Discharge: 2023-06-30 | Disposition: A | Discharge status: Home / Self Care | Payer: BC Managed Care – PPO | Source: Ambulatory Visit | Attending: Family Medicine | Admitting: Family Medicine

## 2023-06-30 ENCOUNTER — Ambulatory Visit (INDEPENDENT_AMBULATORY_CARE_PROVIDER_SITE_OTHER): Payer: BC Managed Care – PPO | Admitting: Family Medicine

## 2023-06-30 ENCOUNTER — Encounter: Payer: Self-pay | Admitting: Family Medicine

## 2023-06-30 VITALS — BP 126/78 | HR 65 | Temp 97.8°F | Ht 71.0 in | Wt 306.0 lb

## 2023-06-30 DIAGNOSIS — Z6841 Body Mass Index (BMI) 40.0 and over, adult: Secondary | ICD-10-CM | POA: Diagnosis not present

## 2023-06-30 DIAGNOSIS — R7303 Prediabetes: Secondary | ICD-10-CM | POA: Diagnosis not present

## 2023-06-30 DIAGNOSIS — R300 Dysuria: Secondary | ICD-10-CM | POA: Insufficient documentation

## 2023-06-30 LAB — POCT URINALYSIS DIPSTICK
Bilirubin, UA: 1
Blood, UA: 1
Glucose, UA: NEGATIVE
Ketones, UA: NEGATIVE
Nitrite, UA: POSITIVE
Protein, UA: NEGATIVE
Spec Grav, UA: 1.015 (ref 1.010–1.025)
Urobilinogen, UA: 2 U/dL — AB
pH, UA: 7 (ref 5.0–8.0)

## 2023-06-30 LAB — BASIC METABOLIC PANEL
BUN: 10 mg/dL (ref 6–23)
CO2: 27 meq/L (ref 19–32)
Calcium: 8.9 mg/dL (ref 8.4–10.5)
Chloride: 104 meq/L (ref 96–112)
Creatinine, Ser: 0.93 mg/dL (ref 0.40–1.50)
GFR: 96.59 mL/min (ref >60.00)
Glucose, Bld: 104 mg/dL — ABNORMAL HIGH (ref 70–99)
Potassium: 3.8 meq/L (ref 3.5–5.1)
Sodium: 138 meq/L (ref 135–145)

## 2023-06-30 LAB — HEMOGLOBIN A1C: Hgb A1c MFr Bld: 6 % (ref 4.6–6.5)

## 2023-06-30 MED ORDER — SULFAMETHOXAZOLE-TRIMETHOPRIM 800-160 MG PO TABS: 1.0000 | ORAL_TABLET | Freq: Two times a day (BID) | ORAL | 0 refills | Status: AC

## 2023-06-30 MED ORDER — MOUNJARO 5 MG/0.5ML ~~LOC~~ SOAJ: 5.0000 mg | SUBCUTANEOUS | 1 refills | Status: DC

## 2023-06-30 NOTE — Progress Notes (Signed)
Established Patient Office Visit   Subjective:  Patient ID: Paul Sweeney., male    DOB: [birth year]  Age: 47 y.o. MRN: [id number]  Chief Complaint  Patient presents with   Urinary Tract Infection    Burning when urinating x 4 days. Pt states he has taken a otc uti medication for 3 days. No improvement. No back pain, no lower abdominal pain, Pt is not sexually active.     Urinary Tract Infection  Associated symptoms include frequency and urgency.   Encounter Diagnoses  Name Primary?   Dysuria Yes   Prediabetes    Morbid obesity due to excess calories (HCC)    For follow-up of above.  [facility]ing [facility].  Has been able to lose 11 pounds.  3 to 4-day history of dysuria with frequency and urgency.  There has been no discharge.  He lives with his mom and 2 children.  Not active sexually for about a year now.   Review of Systems  Constitutional: Negative.   HENT: Negative.    Eyes:  Negative for blurred vision, discharge and redness.  Respiratory: Negative.    Cardiovascular: Negative.   Gastrointestinal:  Negative for abdominal pain.  Genitourinary:  Positive for dysuria, frequency and urgency.  Musculoskeletal: Negative.  Negative for myalgias.  Skin:  Negative for rash.  Neurological:  Negative for tingling, loss of consciousness and weakness.  Endo/Heme/Allergies:  Negative for polydipsia.     Current Outpatient Medications:    FLUoxetine (PROZAC) 20 MG tablet, TAKE 1 TABLET BY MOUTH EVERY DAY, Disp: 90 tablet, Rfl: 1   fluticasone (FLONASE) 50 MCG/ACT nasal spray, Place 2 sprays into both nostrils daily., Disp: 16 g, Rfl: 6   lansoprazole (PREVACID) 30 MG capsule, TAKE 1 CAPSULE BY MOUTH EVERY DAY FOR GERD, Disp: 90 capsule, Rfl: 1   meclizine (ANTIVERT) 25 MG tablet, Take 0.5-1 tablets (12.5-25 mg total) by mouth 3 (three) times daily as needed for dizziness., Disp: 30 tablet, Rfl: 0   metFORMIN (GLUCOPHAGE-XR) 500 MG 24 hr tablet, TAKE 1 TABLET BY MOUTH  EVERY DAY WITH BREAKFAST, Disp: 90 tablet, Rfl: 1   Multiple Vitamins-Minerals (MULTIVITAL) tablet, Take 1 tablet by mouth daily. , Disp: , Rfl:    sulfamethoxazole-trimethoprim (BACTRIM DS) 800-160 MG tablet, Take 1 tablet by mouth 2 (two) times daily for 7 days., Disp: 14 tablet, Rfl: 0   tirzepatide (MOUNJARO) 5 MG/0.5ML Pen, Inject 5 mg into the skin once a week., Disp: 6 mL, Rfl: 1   Objective:     BP 126/78 (Cuff Size: Large)   Pulse 65   Temp 97.8 F (36.6 C)   Ht 5\' 11"  (1.803 m)   Wt (!) 306 lb (138.8 kg)   SpO2 98%   BMI 42.68 kg/m  Wt Readings from Last 3 Encounters:  06/30/23 (!) 306 lb (138.8 kg)  04/22/23 (!) 313 lb 9.6 oz (142.2 kg)  04/06/23 (!) 317 lb 12.8 oz (144.2 kg)      Physical Exam Constitutional:      General: He is not in acute distress.    Appearance: Normal appearance. He is not ill-appearing, toxic-appearing or diaphoretic.  HENT:     Head: Normocephalic and atraumatic.     Right Ear: External ear normal.     Left Ear: External ear normal.     Mouth/Throat:     Mouth: Mucous membranes are moist.     Pharynx: Oropharynx is clear. No oropharyngeal exudate or posterior oropharyngeal erythema.  Eyes:     General: No scleral icterus.       Right eye: No discharge.        Left eye: No discharge.     Extraocular Movements: Extraocular movements intact.     Conjunctiva/sclera: Conjunctivae normal.     Pupils: Pupils are equal, round, and reactive to light.  Cardiovascular:     Rate and Rhythm: Normal rate and regular rhythm.  Pulmonary:     Effort: Pulmonary effort is normal. No respiratory distress.     Breath sounds: Normal breath sounds.  Abdominal:     General: Bowel sounds are normal.     Tenderness: There is no abdominal tenderness. There is no right CVA tenderness, left CVA tenderness or guarding.  Musculoskeletal:     Cervical back: No rigidity or tenderness.  Skin:    General: Skin is warm and dry.  Neurological:     Mental Status:  He is alert and oriented to person, place, and time.  Psychiatric:        Mood and Affect: Mood normal.        Behavior: Behavior normal.      No results found for any visits on 06/30/23.    The ASCVD Risk score (Arnett DK, et al., 2019) failed to calculate for the following reasons:   Cannot find a previous HDL lab   Cannot find a previous total cholesterol lab    Assessment & Plan:   Dysuria -     POCT urinalysis dipstick -     Urine Culture -     Sulfamethoxazole-Trimethoprim; Take 1 tablet by mouth 2 (two) times daily for 7 days.  Dispense: 14 tablet; Refill: 0 -     Urine cytology ancillary only  Prediabetes -     Mounjaro; Inject 5 mg into the skin once a week.  Dispense: 6 mL; Refill: 1 -     Basic metabolic panel -     Hemoglobin A1c  Morbid obesity due to excess calories (HCC) -     Mounjaro; Inject 5 mg into the skin once a week.  Dispense: 6 mL; Refill: 1    Return in about 3 months (around 09/28/2023), or Please exercise by walking for 30 minutes daily., for annual physical.  Continue Mounjaro metformin for diabetes.  Will start with Septra for dysuria.  Adjustments made pending results.  Information given on exercising to lose weight.  Mliss Sax, MDAdjusted follow-up given.  Recheck in for COVID and strep

## 2023-07-01 LAB — URINE CULTURE
MICRO NUMBER:: 15888365
Result:: NO GROWTH
SPECIMEN QUALITY:: ADEQUATE

## 2023-07-02 LAB — URINE CYTOLOGY ANCILLARY ONLY
Chlamydia: NEGATIVE
Comment: NEGATIVE
Comment: NEGATIVE
Comment: NORMAL
Neisseria Gonorrhea: NEGATIVE
Trichomonas: NEGATIVE

## 2023-07-04 DIAGNOSIS — G4733 Obstructive sleep apnea (adult) (pediatric): Secondary | ICD-10-CM | POA: Diagnosis not present

## 2023-07-14 ENCOUNTER — Other Ambulatory Visit: Payer: Self-pay | Admitting: Family Medicine

## 2023-07-14 DIAGNOSIS — K219 Gastro-esophageal reflux disease without esophagitis: Secondary | ICD-10-CM

## 2023-08-03 DIAGNOSIS — G4733 Obstructive sleep apnea (adult) (pediatric): Secondary | ICD-10-CM | POA: Diagnosis not present

## 2023-08-06 DIAGNOSIS — G4733 Obstructive sleep apnea (adult) (pediatric): Secondary | ICD-10-CM | POA: Diagnosis not present

## 2023-09-03 DIAGNOSIS — G4733 Obstructive sleep apnea (adult) (pediatric): Secondary | ICD-10-CM | POA: Diagnosis not present

## 2023-09-24 ENCOUNTER — Telehealth: Payer: Self-pay | Admitting: Family Medicine

## 2023-09-24 NOTE — Telephone Encounter (Signed)
 ERROR

## 2023-09-30 ENCOUNTER — Telehealth: Payer: Self-pay | Admitting: Family Medicine

## 2023-10-01 DIAGNOSIS — G4733 Obstructive sleep apnea (adult) (pediatric): Secondary | ICD-10-CM | POA: Diagnosis not present

## 2023-10-01 NOTE — Telephone Encounter (Signed)
error 

## 2023-10-23 ENCOUNTER — Other Ambulatory Visit: Payer: Self-pay | Admitting: Family Medicine

## 2023-10-23 DIAGNOSIS — R0981 Nasal congestion: Secondary | ICD-10-CM

## 2023-10-26 ENCOUNTER — Encounter: Payer: BC Managed Care – PPO | Admitting: Family Medicine

## 2023-11-02 DIAGNOSIS — G4733 Obstructive sleep apnea (adult) (pediatric): Secondary | ICD-10-CM | POA: Diagnosis not present

## 2023-11-04 DIAGNOSIS — G4733 Obstructive sleep apnea (adult) (pediatric): Secondary | ICD-10-CM | POA: Diagnosis not present

## 2023-11-10 ENCOUNTER — Ambulatory Visit (INDEPENDENT_AMBULATORY_CARE_PROVIDER_SITE_OTHER): Admitting: Family Medicine

## 2023-11-10 ENCOUNTER — Encounter: Payer: Self-pay | Admitting: Family Medicine

## 2023-11-10 VITALS — BP 128/78 | HR 59 | Temp 98.1°F | Ht 71.0 in | Wt 279.8 lb

## 2023-11-10 DIAGNOSIS — Z0000 Encounter for general adult medical examination without abnormal findings: Secondary | ICD-10-CM

## 2023-11-10 DIAGNOSIS — Z13220 Encounter for screening for lipoid disorders: Secondary | ICD-10-CM

## 2023-11-10 DIAGNOSIS — R319 Hematuria, unspecified: Secondary | ICD-10-CM

## 2023-11-10 DIAGNOSIS — Z1211 Encounter for screening for malignant neoplasm of colon: Secondary | ICD-10-CM

## 2023-11-10 DIAGNOSIS — R7303 Prediabetes: Secondary | ICD-10-CM | POA: Insufficient documentation

## 2023-11-10 DIAGNOSIS — Z23 Encounter for immunization: Secondary | ICD-10-CM | POA: Diagnosis not present

## 2023-11-10 DIAGNOSIS — F39 Unspecified mood [affective] disorder: Secondary | ICD-10-CM

## 2023-11-10 LAB — CBC WITH DIFFERENTIAL/PLATELET
Basophils Absolute: 0 10*3/uL (ref 0.0–0.1)
Basophils Relative: 0.7 % (ref 0.0–3.0)
Eosinophils Absolute: 0.1 10*3/uL (ref 0.0–0.7)
Eosinophils Relative: 2.2 % (ref 0.0–5.0)
HCT: 46.8 % (ref 39.0–52.0)
Hemoglobin: 15.7 g/dL (ref 13.0–17.0)
Lymphocytes Relative: 34.3 % (ref 12.0–46.0)
Lymphs Abs: 1.9 10*3/uL (ref 0.7–4.0)
MCHC: 33.6 g/dL (ref 30.0–36.0)
MCV: 94 fl (ref 78.0–100.0)
Monocytes Absolute: 0.5 10*3/uL (ref 0.1–1.0)
Monocytes Relative: 9 % (ref 3.0–12.0)
Neutro Abs: 2.9 10*3/uL (ref 1.4–7.7)
Neutrophils Relative %: 53.8 % (ref 43.0–77.0)
Platelets: 216 10*3/uL (ref 150.0–400.0)
RBC: 4.98 Mil/uL (ref 4.22–5.81)
RDW: 12.6 % (ref 11.5–15.5)
WBC: 5.5 10*3/uL (ref 4.0–10.5)

## 2023-11-10 LAB — URINALYSIS, ROUTINE W REFLEX MICROSCOPIC
Bilirubin Urine: NEGATIVE
Ketones, ur: NEGATIVE
Leukocytes,Ua: NEGATIVE
Nitrite: NEGATIVE
Specific Gravity, Urine: 1.02 (ref 1.000–1.030)
Total Protein, Urine: NEGATIVE
Urine Glucose: NEGATIVE
Urobilinogen, UA: 1 (ref 0.0–1.0)
pH: 6 (ref 5.0–8.0)

## 2023-11-10 LAB — COMPREHENSIVE METABOLIC PANEL WITH GFR
ALT: 9 U/L (ref 0–53)
AST: 21 U/L (ref 0–37)
Albumin: 4.1 g/dL (ref 3.5–5.2)
Alkaline Phosphatase: 98 U/L (ref 39–117)
BUN: 16 mg/dL (ref 6–23)
CO2: 29 meq/L (ref 19–32)
Calcium: 9 mg/dL (ref 8.4–10.5)
Chloride: 105 meq/L (ref 96–112)
Creatinine, Ser: 0.78 mg/dL (ref 0.40–1.50)
GFR: 104.64 mL/min (ref >60.00)
Glucose, Bld: 90 mg/dL (ref 70–99)
Potassium: 4.1 meq/L (ref 3.5–5.1)
Sodium: 140 meq/L (ref 135–145)
Total Bilirubin: 1 mg/dL (ref 0.2–1.2)
Total Protein: 6.6 g/dL (ref 6.0–8.3)

## 2023-11-10 LAB — LIPID PANEL
Cholesterol: 172 mg/dL (ref 0–200)
HDL: 39 mg/dL — ABNORMAL LOW (ref >39.00)
LDL Cholesterol: 120 mg/dL — ABNORMAL HIGH (ref 0–99)
NonHDL: 132.73
Total CHOL/HDL Ratio: 4
Triglycerides: 65 mg/dL (ref 0.0–149.0)
VLDL: 13 mg/dL (ref 0.0–40.0)

## 2023-11-10 LAB — HEMOGLOBIN A1C: Hgb A1c MFr Bld: 5.4 % (ref 4.6–6.5)

## 2023-11-10 MED ORDER — BUSPIRONE HCL 7.5 MG PO TABS: 7.5000 mg | ORAL_TABLET | Freq: Two times a day (BID) | ORAL | 0 refills | Status: DC | PRN

## 2023-11-10 NOTE — Addendum Note (Signed)
 Addended by: Delene Feinstein on: 11/10/2023 03:32 PM   Modules accepted: Orders

## 2023-11-10 NOTE — Progress Notes (Signed)
 Established Patient Office Visit   Subjective:  Patient ID: Paul Harvan., male    DOB: March 15, 1976  Age: 48 y.o. MRN: [id number]  Chief Complaint  Patient presents with   Annual Exam    CPE. Pt is fasting. Wants tdap, and Prevnar today. GI referral fro colonoscopy.    HPI Encounter Diagnoses  Name Primary?   Healthcare maintenance Yes   Screening for colon cancer    Immunization due    Prediabetes    Morbid obesity due to excess calories (HCC)    Anxiety and Depression    Screening for cholesterol level    For follow-up of above and a physical.  Doing well with the tirzepatide .  Has been able to lose 27 pounds.  Has not had any troubles taking the medicine.  Denies nausea abdominal pain constipation or diarrhea.  Prozac  is helpful.  He did experience a panic attack when he was in a crowded room with a large number of people at a work event.  He does not have regular dental care.  He lives at home with his kids and mother.   Review of Systems  Constitutional: Negative.   HENT: Negative.    Eyes:  Negative for blurred vision, discharge and redness.  Respiratory: Negative.    Cardiovascular: Negative.   Gastrointestinal:  Negative for abdominal pain.  Genitourinary: Negative.   Musculoskeletal: Negative.  Negative for myalgias.  Skin:  Negative for rash.  Neurological:  Negative for tingling, loss of consciousness and weakness.  Endo/Heme/Allergies:  Negative for polydipsia.      11/10/2023    9:55 AM 12/23/2021    4:41 PM 12/23/2021    3:55 PM  Depression screen PHQ 2/9  Decreased Interest 1 1 0  Down, Depressed, Hopeless 0 1 0  PHQ - 2 Score 1 2 0  Altered sleeping 1 1   Tired, decreased energy 2 2   Change in appetite 0 1   Feeling bad or failure about yourself  1 1   Trouble concentrating 0 1   Moving slowly or fidgety/restless 0 1   Suicidal thoughts 0 0   PHQ-9 Score 5 9   Difficult doing work/chores Not difficult at all Somewhat difficult        Current Outpatient Medications:    busPIRone (BUSPAR) 7.5 MG tablet, Take 1 tablet (7.5 mg total) by mouth 2 (two) times daily as needed., Disp: 30 tablet, Rfl: 0   FLUoxetine  (PROZAC ) 20 MG tablet, TAKE 1 TABLET BY MOUTH EVERY DAY, Disp: 90 tablet, Rfl: 1   fluticasone  (FLONASE ) 50 MCG/ACT nasal spray, SPRAY 2 SPRAYS INTO EACH NOSTRIL EVERY DAY, Disp: 48 mL, Rfl: 2   lansoprazole  (PREVACID ) 30 MG capsule, TAKE 1 CAPSULE BY MOUTH EVERY DAY FOR GERD, Disp: 90 capsule, Rfl: 1   meclizine  (ANTIVERT ) 25 MG tablet, Take 0.5-1 tablets (12.5-25 mg total) by mouth 3 (three) times daily as needed for dizziness., Disp: 30 tablet, Rfl: 0   metFORMIN  (GLUCOPHAGE -XR) 500 MG 24 hr tablet, TAKE 1 TABLET BY MOUTH EVERY DAY WITH BREAKFAST, Disp: 90 tablet, Rfl: 1   Multiple Vitamins-Minerals (MULTIVITAL) tablet, Take 1 tablet by mouth daily. , Disp: , Rfl:    tirzepatide  (MOUNJARO ) 5 MG/0.5ML Pen, Inject 5 mg into the skin once a week., Disp: 6 mL, Rfl: 1   Objective:     BP 128/78 (BP Location: Right Arm, Patient Position: Sitting, Cuff Size: Large)   Pulse (!) 59   Temp 98.1 F (36.7  C) (Temporal)   Ht 5\' 11"  (1.803 m)   Wt 279 lb 12.8 oz (126.9 kg)   SpO2 97%   BMI 39.02 kg/m  Wt Readings from Last 3 Encounters:  11/10/23 279 lb 12.8 oz (126.9 kg)  06/30/23 (!) 306 lb (138.8 kg)  04/22/23 (!) 313 lb 9.6 oz (142.2 kg)      Physical Exam Constitutional:      General: He is not in acute distress.    Appearance: Normal appearance. He is not ill-appearing, toxic-appearing or diaphoretic.  HENT:     Head: Normocephalic and atraumatic.     Right Ear: Tympanic membrane, ear canal and external ear normal.     Left Ear: Tympanic membrane, ear canal and external ear normal.     Mouth/Throat:     Mouth: Mucous membranes are moist.     Pharynx: Oropharynx is clear. No oropharyngeal exudate or posterior oropharyngeal erythema.  Eyes:     General: No scleral icterus.       Right eye: No  discharge.        Left eye: No discharge.     Extraocular Movements: Extraocular movements intact.     Conjunctiva/sclera: Conjunctivae normal.     Pupils: Pupils are equal, round, and reactive to light.  Cardiovascular:     Rate and Rhythm: Normal rate and regular rhythm.  Pulmonary:     Effort: Pulmonary effort is normal. No respiratory distress.     Breath sounds: Normal breath sounds.  Abdominal:     General: Bowel sounds are normal.     Tenderness: There is no abdominal tenderness. There is no guarding.     Hernia: There is no hernia in the left inguinal area or right inguinal area.  Genitourinary:    Testes:        Right: Mass, tenderness or swelling not present. Right testis is descended.        Left: Mass, tenderness or swelling not present. Left testis is descended.     Epididymis:     Right: Not inflamed or enlarged.     Left: Not inflamed or enlarged.  Musculoskeletal:     Cervical back: No rigidity or tenderness.  Lymphadenopathy:     Lower Body: No right inguinal adenopathy. No left inguinal adenopathy.  Skin:    General: Skin is warm and dry.  Neurological:     Mental Status: He is alert and oriented to person, place, and time.  Psychiatric:        Mood and Affect: Mood normal.        Behavior: Behavior normal.      No results found for any visits on 11/10/23.    The ASCVD Risk score (Arnett DK, et al., 2019) failed to calculate for the following reasons:   Cannot find a previous HDL lab   Cannot find a previous total cholesterol lab    Assessment & Plan:   Healthcare maintenance -     CBC with Differential/Platelet -     Urinalysis, Routine w reflex microscopic  Screening for colon cancer -     Ambulatory referral to Gastroenterology  Immunization due -     Tdap vaccine greater than or equal to 7yo IM -     Pneumococcal conjugate vaccine 20-valent  Prediabetes -     Comprehensive metabolic panel with GFR -     Hemoglobin A1c  Morbid obesity  due to excess calories (HCC)  Anxiety and Depression -     busPIRone  HCl; Take 1 tablet (7.5 mg total) by mouth 2 (two) times daily as needed.  Dispense: 30 tablet; Refill: 0  Screening for cholesterol level -     Comprehensive metabolic panel with GFR -     Lipid panel    Return in about 6 months (around 05/12/2024).  Encouraged regular exercise of at least 250 minutes weekly and 300 minutes for weight loss.  Continue tirzepatide .  Continue fluoxetine .  Will add BuSpar for crowded social occasions.  Tonna Frederic, MD

## 2023-11-30 ENCOUNTER — Other Ambulatory Visit: Payer: Self-pay | Admitting: Family Medicine

## 2023-11-30 DIAGNOSIS — F39 Unspecified mood [affective] disorder: Secondary | ICD-10-CM

## 2023-12-02 DIAGNOSIS — G4733 Obstructive sleep apnea (adult) (pediatric): Secondary | ICD-10-CM | POA: Diagnosis not present

## 2023-12-25 ENCOUNTER — Encounter: Payer: Self-pay | Admitting: Internal Medicine

## 2023-12-26 ENCOUNTER — Other Ambulatory Visit: Payer: Self-pay | Admitting: Family Medicine

## 2023-12-26 DIAGNOSIS — R7303 Prediabetes: Secondary | ICD-10-CM

## 2024-01-02 DIAGNOSIS — G4733 Obstructive sleep apnea (adult) (pediatric): Secondary | ICD-10-CM | POA: Diagnosis not present

## 2024-01-07 ENCOUNTER — Ambulatory Visit

## 2024-01-07 VITALS — Ht 71.0 in | Wt 280.0 lb

## 2024-01-07 DIAGNOSIS — Z1211 Encounter for screening for malignant neoplasm of colon: Secondary | ICD-10-CM

## 2024-01-07 MED ORDER — NA SULFATE-K SULFATE-MG SULF 17.5-3.13-1.6 GM/177ML PO SOLN: 1.0000 | Freq: Once | ORAL | 0 refills | Status: AC

## 2024-01-07 NOTE — Progress Notes (Signed)
 No egg or soy allergy known to patient  No issues known to pt with past sedation with any surgeries or procedures Patient denies ever being told they had issues or difficulty with intubation  No FH of Malignant Hyperthermia  Pt is not on diet pills, Is on Mounjaro  and hold instructions provided  Pt is not on  home 02  Pt is not on blood thinners  Pt denies issues with constipation  No A fib or A flutter Have any cardiac testing pending--No Pt can ambulate  Pt denies use of chewing tobacco Discussed diabetic I weight loss medication holds Discussed NSAID holds Checked BMI Pt instructed to use Singlecare.com or GoodRx for a price reduction on prep  Patient's chart reviewed by Norleen Schillings CNRA prior to previsit and patient appropriate for the [facility].  Pre visit completed and red dot placed by patient's name on their procedure day (on provider's schedule).

## 2024-01-20 ENCOUNTER — Ambulatory Visit (INDEPENDENT_AMBULATORY_CARE_PROVIDER_SITE_OTHER): Admitting: Family Medicine

## 2024-01-20 ENCOUNTER — Encounter: Payer: Self-pay | Admitting: Family Medicine

## 2024-01-20 VITALS — BP 122/80 | HR 62 | Temp 97.3°F | Ht 71.0 in | Wt 276.4 lb

## 2024-01-20 DIAGNOSIS — R3915 Urgency of urination: Secondary | ICD-10-CM | POA: Diagnosis not present

## 2024-01-20 LAB — POC URINALSYSI DIPSTICK (AUTOMATED)
Bilirubin, UA: NEGATIVE
Glucose, UA: NEGATIVE
Ketones, UA: NEGATIVE
Leukocytes, UA: NEGATIVE
Nitrite, UA: NEGATIVE
Protein, UA: NEGATIVE
Spec Grav, UA: 1.01 (ref 1.010–1.025)
Urobilinogen, UA: 0.2 U/dL
pH, UA: 7 (ref 5.0–8.0)

## 2024-01-20 LAB — URINALYSIS, MICROSCOPIC ONLY

## 2024-01-20 LAB — PSA: PSA: 0.57 ng/mL (ref 0.10–4.00)

## 2024-01-20 MED ORDER — TAMSULOSIN HCL 0.4 MG PO CAPS: 0.4000 mg | ORAL_CAPSULE | Freq: Every day | ORAL | 3 refills | Status: DC | PRN

## 2024-01-20 NOTE — Progress Notes (Signed)
 Assessment & Plan   Assessment/Plan:   Assessment and Plan    Urinary Frequency Intermittent urinary frequency and burning sensation for two days, now resolved. Previous similar episode in December with negative urine culture and negative tests for gonorrhea and chlamydia. Differential includes urinary tract infection, benign prostatic hyperplasia, or other urological issues. No fever, abdominal pain, penile sores, discharge, or testicular discomfort. Mild hematuria noted. Discussed potential hereditary nature of benign prostatic hyperplasia given family history. PSA test is a starting point for prostate evaluation. Referral to urology is recommended for further evaluation, including potential advanced imaging or biopsy if necessary. - Order urine culture and retest for gonorrhea and chlamydia - Order PSA test - Refer to urology for further evaluation and management - Prescribe tamsulosin  for symptomatic relief if urinary retention occurs - Advise increased fluid intake - Recommend Tylenol or ibuprofen  for discomfort  General Health Maintenance Discussed importance of prostate health screening given family history of benign prostatic hyperplasia and age approaching 66. Emphasized need for regular screenings and monitoring of symptoms. - Encourage regular health screenings appropriate for age and family history  Follow-up Plan to follow up on test results and urology referral. Discussed importance of monitoring symptoms and seeking further care if needed. Provided work excuse for the week with option to return sooner if symptoms improve. - Follow up regarding test results - Ensure urology referral is completed - Advise to contact clinic if symptoms worsen or do not improve - Provide work excuse for the week with option to return sooner if symptoms improve         There are no discontinued medications.  Return if symptoms worsen or fail to improve.        Subjective:    Encounter date: 01/20/2024  Paul Sweeney. is a 48 y.o. male who has Hyperlipemia; Gastroesophageal reflux disease; Anxiety and Depression; Obstructive sleep apnea; Morbid obesity due to excess calories (HCC); Screening for cholesterol level; COVID-19; Post-viral cough syndrome; Upper respiratory tract infection; Dizziness; Sinus congestion; Immunization due; Prediabetes; and Hematuria on their problem list..   He  has a past medical history of Anxiety and depression, Chicken pox, Obesity, Plantar fasciitis, and Sleep apnea.SABRA   He presents with chief complaint of Urinary Retention (For 2 days and feels ok now) .  Discussed the use of AI scribe software for clinical note transcription with the patient, who gave verbal consent to proceed.  History of Present Illness   The patient is a 48 year old who presents with urinary frequency and discomfort.  He has been experiencing urinary frequency for the past two days, with the most significant discomfort occurring last night. He describes an intense urge to urinate, feeling like he 'couldn't get off the toilet,' but notes that the symptoms have since improved, with only a slight burning sensation remaining. He did not notice any blood in his urine himself, but a small amount was detected on testing. No penile sores, discharge, testicular discomfort, abdominal pain, constipation, diarrhea, fever, or vomiting.  He has a history of a urinary tract infection (UTI) in December, during which a urine culture showed no growth, but leukocytes were present. He was treated for a UTI at that time, although he is unsure if it was a true infection. He is not sexually active and previously tested negative for gonorrhea and chlamydia in December.  His father has a history of an enlarged prostate, which he discussed with him recently. He is currently taking Mounjaro  and has an  A1c of 5.4. He has missed work due to his symptoms and is concerned about returning  while still experiencing urinary urgency.        ROS  Past Surgical History:  Procedure Laterality Date   TONSILLECTOMY     age 27     Outpatient Medications Prior to Visit  Medication Sig Dispense Refill   busPIRone  (BUSPAR ) 7.5 MG tablet TAKE 1 TABLET BY MOUTH 2 TIMES DAILY AS NEEDED. 180 tablet 1   lansoprazole  (PREVACID ) 30 MG capsule TAKE 1 CAPSULE BY MOUTH EVERY DAY FOR GERD 90 capsule 1   Multiple Vitamins-Minerals (MULTIVITAL) tablet Take 1 tablet by mouth daily.      tirzepatide  (MOUNJARO ) 5 MG/0.5ML Pen INJECT 5 MG SUBCUTANEOUSLY WEEKLY 6 mL 1   FLUoxetine  (PROZAC ) 20 MG tablet TAKE 1 TABLET BY MOUTH EVERY DAY (Patient not taking: Reported on 01/20/2024) 90 tablet 1   fluticasone  (FLONASE ) 50 MCG/ACT nasal spray SPRAY 2 SPRAYS INTO EACH NOSTRIL EVERY DAY (Patient not taking: Reported on 01/20/2024) 48 mL 2   meclizine  (ANTIVERT ) 25 MG tablet Take 0.5-1 tablets (12.5-25 mg total) by mouth 3 (three) times daily as needed for dizziness. (Patient not taking: Reported on 01/20/2024) 30 tablet 0   metFORMIN  (GLUCOPHAGE -XR) 500 MG 24 hr tablet TAKE 1 TABLET BY MOUTH EVERY DAY WITH BREAKFAST (Patient not taking: Reported on 01/20/2024) 90 tablet 1   No facility-administered medications prior to visit.    Family History  Problem Relation Age of Onset   Hypertension Mother    Diabetes Mother    High Cholesterol Mother    Colon cancer Father    Healthy Sister    Healthy Brother    Healthy Brother    Healthy Brother    Colon cancer Other    Prostate cancer Other    Rectal cancer Neg Hx    Stomach cancer Neg Hx    Esophageal cancer Neg Hx     Social History   Socioeconomic History   Marital status: Single    Spouse name: Not on file   Number of children: Not on file   Years of education: Not on file   Highest education level: Not on file  Occupational History   Not on file  Tobacco Use   Smoking status: Never   Smokeless tobacco: Never  Vaping Use   Vaping status:  Never Used  Substance and Sexual Activity   Alcohol use: Not Currently    Alcohol/week: 1.0 standard drink of alcohol    Types: 1 Cans of beer per week   Drug use: No   Sexual activity: Not Currently  Other Topics Concern   Not on file  Social History Narrative   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 Situation: lives with kids (12, 8 and 6 in 2014)      Spiritual Beliefs:      Lifestyle: exercises daily - gym 5 days, CV, swimming, weights; diet is improving - son has glut-1 and on adkins type diet, pt has lost 30 lbs in last few months            Social Drivers of Corporate investment banker Strain: Not on file  Food Insecurity: Not on file  Transportation Needs: Not on file  Physical Activity: Not on file  Stress: Not on file  Social Connections: Not on file  Intimate Partner Violence: Not on file  Objective:  Physical Exam: BP 122/80 (BP Location: Left Arm, Patient Position: Sitting, Cuff Size: Normal)   Pulse 62   Temp (!) 97.3 F (36.3 C)   Ht 5' 11 (1.803 m)   Wt 276 lb 6.4 oz (125.4 kg)   SpO2 98%   BMI 38.55 kg/m    Physical Exam    Physical Exam  No results found.  Recent Results (from the past 2160 hours)  CBC with Differential/Platelet     Status: None   Collection Time: 11/10/23 10:06 AM  Result Value Ref Range   WBC 5.5 4.0 - 10.5 K/uL   RBC 4.98 4.22 - 5.81 Mil/uL   Hemoglobin 15.7 13.0 - 17.0 g/dL   HCT 53.1 60.9 - 47.9 %   MCV 94.0 78.0 - 100.0 fl   MCHC 33.6 30.0 - 36.0 g/dL   RDW 87.3 88.4 - 84.4 %   Platelets 216.0 150.0 - 400.0 K/uL   Neutrophils Relative % 53.8 43.0 - 77.0 %   Lymphocytes Relative 34.3 12.0 - 46.0 %   Monocytes Relative 9.0 3.0 - 12.0 %   Eosinophils Relative 2.2 0.0 - 5.0 %   Basophils Relative 0.7 0.0 - 3.0 %   Neutro Abs 2.9 1.4 - 7.7 K/uL   Lymphs Abs 1.9 0.7 - 4.0 K/uL   Monocytes  Absolute 0.5 0.1 - 1.0 K/uL   Eosinophils Absolute 0.1 0.0 - 0.7 K/uL   Basophils Absolute 0.0 0.0 - 0.1 K/uL  Comprehensive metabolic panel with GFR     Status: None   Collection Time: 11/10/23 10:06 AM  Result Value Ref Range   Sodium 140 135 - 145 mEq/L   Potassium 4.1 3.5 - 5.1 mEq/L   Chloride 105 96 - 112 mEq/L   CO2 29 19 - 32 mEq/L   Glucose, Bld 90 70 - 99 mg/dL   BUN 16 6 - 23 mg/dL   Creatinine, Ser 9.21 0.40 - 1.50 mg/dL   Total Bilirubin 1.0 0.2 - 1.2 mg/dL   Alkaline Phosphatase 98 39 - 117 U/L   AST 21 0 - 37 U/L   ALT 9 0 - 53 U/L   Total Protein 6.6 6.0 - 8.3 g/dL   Albumin 4.1 3.5 - 5.2 g/dL   GFR 895.35 >39.99 mL/min    Comment: Calculated using the CKD-EPI Creatinine Equation (2021)   Calcium 9.0 8.4 - 10.5 mg/dL  Lipid panel     Status: Abnormal   Collection Time: 11/10/23 10:06 AM  Result Value Ref Range   Cholesterol 172 0 - 200 mg/dL    Comment: ATP III Classification       Desirable:  < 200 mg/dL               Borderline High:  200 - 239 mg/dL          High:  > = 759 mg/dL   Triglycerides 34.9 0.0 - 149.0 mg/dL    Comment: Normal:  <849 mg/dLBorderline High:  150 - 199 mg/dL   HDL 60.99 (L) >60.99 mg/dL   VLDL 86.9 0.0 - 59.9 mg/dL   LDL Cholesterol 879 (H) 0 - 99 mg/dL   Total CHOL/HDL Ratio 4     Comment:                Men          Women1/2 Average Risk     3.4          3.3Average Risk  5.0          4.42X Average Risk          9.6          7.13X Average Risk          15.0          11.0                       NonHDL 132.73     Comment: NOTE:  Non-HDL goal should be 30 mg/dL higher than patient's LDL goal (i.e. LDL goal of < 70 mg/dL, would have non-HDL goal of < 100 mg/dL)  Urinalysis, Routine w reflex microscopic     Status: Abnormal   Collection Time: 11/10/23 10:06 AM  Result Value Ref Range   Color, Urine YELLOW Yellow;Lt. Yellow;Straw;Dark Yellow;Amber;Green;Red;Brown   APPearance CLEAR Clear;Turbid;Slightly Cloudy;Cloudy   Specific  Gravity, Urine 1.020 1.000 - 1.030   pH 6.0 5.0 - 8.0   Total Protein, Urine NEGATIVE Negative   Urine Glucose NEGATIVE Negative   Ketones, ur NEGATIVE Negative   Bilirubin Urine NEGATIVE Negative   Hgb urine dipstick SMALL (A) Negative   Urobilinogen, UA 1.0 0.0 - 1.0   Leukocytes,Ua NEGATIVE Negative   Nitrite NEGATIVE Negative   WBC, UA 0-2/hpf 0-2/hpf   RBC / HPF 11-20/hpf (A) 0-2/hpf  Hemoglobin A1c     Status: None   Collection Time: 11/10/23 10:06 AM  Result Value Ref Range   Hgb A1c MFr Bld 5.4 4.6 - 6.5 %    Comment: Glycemic Control Guidelines for People with Diabetes:Non Diabetic:  <6%Goal of Therapy: <7%Additional Action Suggested:  >8%   POCT Urinalysis Dipstick (Automated)     Status: Abnormal   Collection Time: 01/20/24 11:02 AM  Result Value Ref Range   Color, UA     Clarity, UA     Glucose, UA Negative Negative   Bilirubin, UA Negative    Ketones, UA Negative    Spec Grav, UA 1.010 1.010 - 1.025   Blood, UA 1+     Comment: 25Ery/uL   pH, UA 7.0 5.0 - 8.0   Protein, UA Negative Negative   Urobilinogen, UA 0.2 0.2 or 1.0 E.U./dL   Nitrite, UA Negative    Leukocytes, UA Negative Negative        Beverley Adine Hummer, MD, MS

## 2024-01-20 NOTE — Patient Instructions (Addendum)
 VISIT SUMMARY:  You came in today because you have been experiencing urinary frequency and discomfort for the past two days. The most significant discomfort occurred last night, but your symptoms have since improved, with only a slight burning sensation remaining. You have a history of a similar episode in December. We discussed your family history of an enlarged prostate and the potential hereditary nature of benign prostatic hyperplasia.  YOUR PLAN:  -URINARY FREQUENCY: Urinary frequency means needing to urinate more often than usual. This can be caused by various issues, including urinary tract infections (UTIs) or an enlarged prostate. We will order a urine culture and retest for gonorrhea and chlamydia to rule out infections. A PSA test will be done to evaluate your prostate health. You will be referred to a urologist for further evaluation. If you experience urinary retention, tamsulosin  can be prescribed for relief. Increase your fluid intake and take Tylenol or ibuprofen  if you feel any discomfort.  -GENERAL HEALTH MAINTENANCE: Given your family history of benign prostatic hyperplasia and your age, it is important to regularly screen for prostate health issues. Regular health screenings and monitoring of symptoms are essential.  INSTRUCTIONS:  Please follow up regarding your test results and ensure your urology referral is completed. Contact the clinic if your symptoms worsen or do not improve. You have been provided a work excuse for the week, with the option to return sooner if your symptoms improve.

## 2024-01-21 LAB — URINE CULTURE
MICRO NUMBER:: 16706619
Result:: NO GROWTH
SPECIMEN QUALITY:: ADEQUATE

## 2024-01-22 ENCOUNTER — Ambulatory Visit: Payer: Self-pay | Admitting: Family Medicine

## 2024-01-22 LAB — CHLAMYDIA/GC NAA, CONFIRMATION
Chlamydia trachomatis, NAA: NEGATIVE
Neisseria gonorrhoeae, NAA: NEGATIVE

## 2024-01-26 ENCOUNTER — Other Ambulatory Visit: Payer: Self-pay | Admitting: Family Medicine

## 2024-01-26 DIAGNOSIS — K219 Gastro-esophageal reflux disease without esophagitis: Secondary | ICD-10-CM

## 2024-01-28 ENCOUNTER — Encounter: Admitting: Internal Medicine

## 2024-02-15 ENCOUNTER — Ambulatory Visit: Admitting: Urology

## 2024-03-04 DIAGNOSIS — G4733 Obstructive sleep apnea (adult) (pediatric): Secondary | ICD-10-CM | POA: Diagnosis not present

## 2024-03-20 NOTE — Progress Notes (Deleted)
 [facility] History and Physical   Primary Care Physician:  Berneta Elsie Sayre, MD   Reason for Procedure:    Encounter Diagnosis  Name Primary?   Colon cancer screening Yes   '  Plan:    colonoscopy     HPI: Salah Nakamura. is a 48 y.o. male presenting for a screening colonoscopy   Past Medical History:  Diagnosis Date   Anxiety and depression    never hospitalized, remote   Chicken pox    Obesity    Plantar fasciitis    calcaneal bone spur   Sleep apnea     Past Surgical History:  Procedure Laterality Date   TONSILLECTOMY     age 28      Current Outpatient Medications  Medication Sig Dispense Refill   busPIRone  (BUSPAR ) 7.5 MG tablet TAKE 1 TABLET BY MOUTH 2 TIMES DAILY AS NEEDED. 180 tablet 1   FLUoxetine  (PROZAC ) 20 MG tablet TAKE 1 TABLET BY MOUTH EVERY DAY (Patient not taking: Reported on 01/20/2024) 90 tablet 1   fluticasone  (FLONASE ) 50 MCG/ACT nasal spray SPRAY 2 SPRAYS INTO EACH NOSTRIL EVERY DAY (Patient not taking: Reported on 01/20/2024) 48 mL 2   lansoprazole  (PREVACID ) 30 MG capsule TAKE 1 CAPSULE BY MOUTH EVERY DAY FOR GERD 90 capsule 1   meclizine  (ANTIVERT ) 25 MG tablet Take 0.5-1 tablets (12.5-25 mg total) by mouth 3 (three) times daily as needed for dizziness. (Patient not taking: Reported on 01/20/2024) 30 tablet 0   metFORMIN  (GLUCOPHAGE -XR) 500 MG 24 hr tablet TAKE 1 TABLET BY MOUTH EVERY DAY WITH BREAKFAST (Patient not taking: Reported on 01/20/2024) 90 tablet 1   Multiple Vitamins-Minerals (MULTIVITAL) tablet Take 1 tablet by mouth daily.      tamsulosin  (FLOMAX ) 0.4 MG CAPS capsule Take 1 capsule (0.4 mg total) by mouth daily as needed (urinary symptoms). 30 capsule 3   tirzepatide  (MOUNJARO ) 5 MG/0.5ML Pen INJECT 5 MG SUBCUTANEOUSLY WEEKLY 6 mL 1   No current facility-administered medications for this visit.    Allergies as of 03/21/2024   (No Known Allergies)    Family History  Problem Relation Age of Onset    Hypertension Mother    Diabetes Mother    High Cholesterol Mother    Colon cancer Father    Healthy Sister    Healthy Brother    Healthy Brother    Healthy Brother    Colon cancer Other    Prostate cancer Other    Rectal cancer Neg Hx    Stomach cancer Neg Hx    Esophageal cancer Neg Hx     Social History   Socioeconomic History   Marital status: Single    Spouse name: Not on file   Number of children: Not on file   Years of education: Not on file   Highest education level: Not on file  Occupational History   Not on file  Tobacco Use   Smoking status: Never   Smokeless tobacco: Never  Vaping Use   Vaping status: Never Used  Substance and Sexual Activity   Alcohol use: Not Currently    Alcohol/week: 1.0 standard drink of alcohol    Types: 1 Cans of beer per week   Drug use: No   Sexual activity: Not Currently  Other Topics Concern   Not on file  Social History Narrative   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      Home Situation: lives with kids (12, 8 and 6  in 2014)      Spiritual Beliefs:      Lifestyle: exercises daily - gym 5 days, CV, swimming, weights; diet is improving - son has glut-1 and on adkins type diet, pt has lost 30 lbs in last few months            Social Drivers of Corporate investment banker Strain: Not on file  Food Insecurity: Not on file  Transportation Needs: Not on file  Physical Activity: Not on file  Stress: Not on file  Social Connections: Not on file  Intimate Partner Violence: Not on file    Review of Systems: Positive for *** All other review of systems negative except as mentioned in the HPI.  Physical Exam: Vital signs There were no vitals taken for this visit.  General:   Alert,  Well-developed, well-nourished, pleasant and cooperative in NAD Lungs:  Clear throughout to auscultation.   Heart:  Regular rate and rhythm; no murmurs, clicks, rubs,  or gallops. Abdomen:  Soft, nontender and nondistended.  Normal bowel sounds.   Neuro/Psych:  Alert and cooperative. Normal mood and affect. A and O x 3   @Astou Lada  CHARLENA Commander, MD, [facility] [phone] (pager) 03/20/2024 5:40 PM@

## 2024-03-21 ENCOUNTER — Telehealth: Payer: Self-pay | Admitting: Internal Medicine

## 2024-03-21 ENCOUNTER — Encounter: Admitting: Internal Medicine

## 2024-03-21 DIAGNOSIS — Z1211 Encounter for screening for malignant neoplasm of colon: Secondary | ICD-10-CM

## 2024-03-21 NOTE — Telephone Encounter (Signed)
 Good Morning Dr. Avram,  I called this patient at 7:15am this moring patient stated he forgot all about this procedure.  He will call back to reschedule.  I will NO SHOW him.  [employer]

## 2024-04-16 ENCOUNTER — Other Ambulatory Visit: Payer: Self-pay | Admitting: Family Medicine

## 2024-04-16 DIAGNOSIS — R3915 Urgency of urination: Secondary | ICD-10-CM

## 2024-04-18 IMAGING — DX DG KNEE COMPLETE 4+V*L*
4 series · 4 of 4 positions shown · non-contrast
Comparison: None Available.

CLINICAL DATA: Knee pain since [REDACTED].

EXAM:
LEFT KNEE - COMPLETE 4+ VIEW

[knee ap]
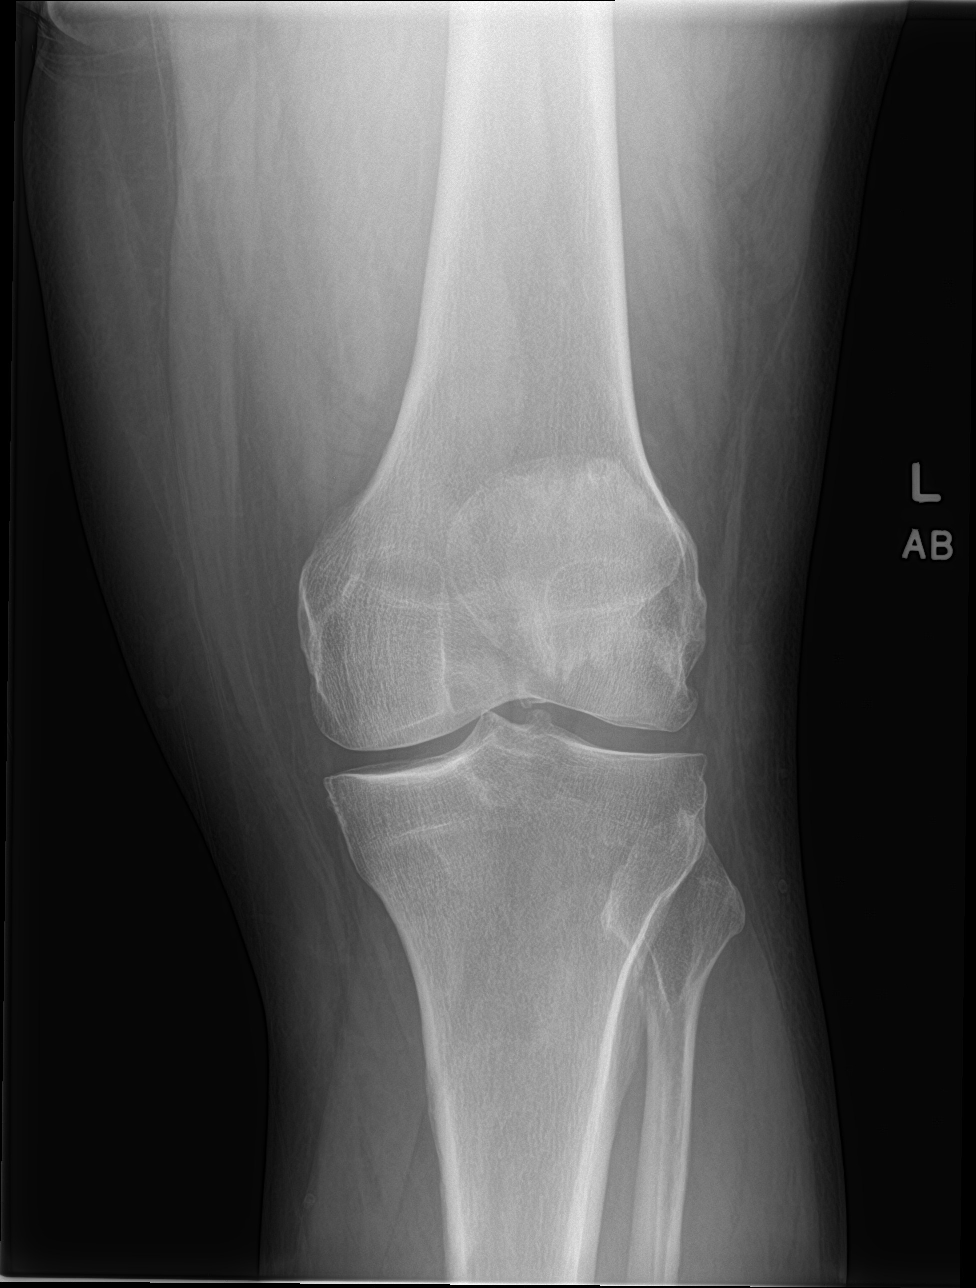

[knee lat]
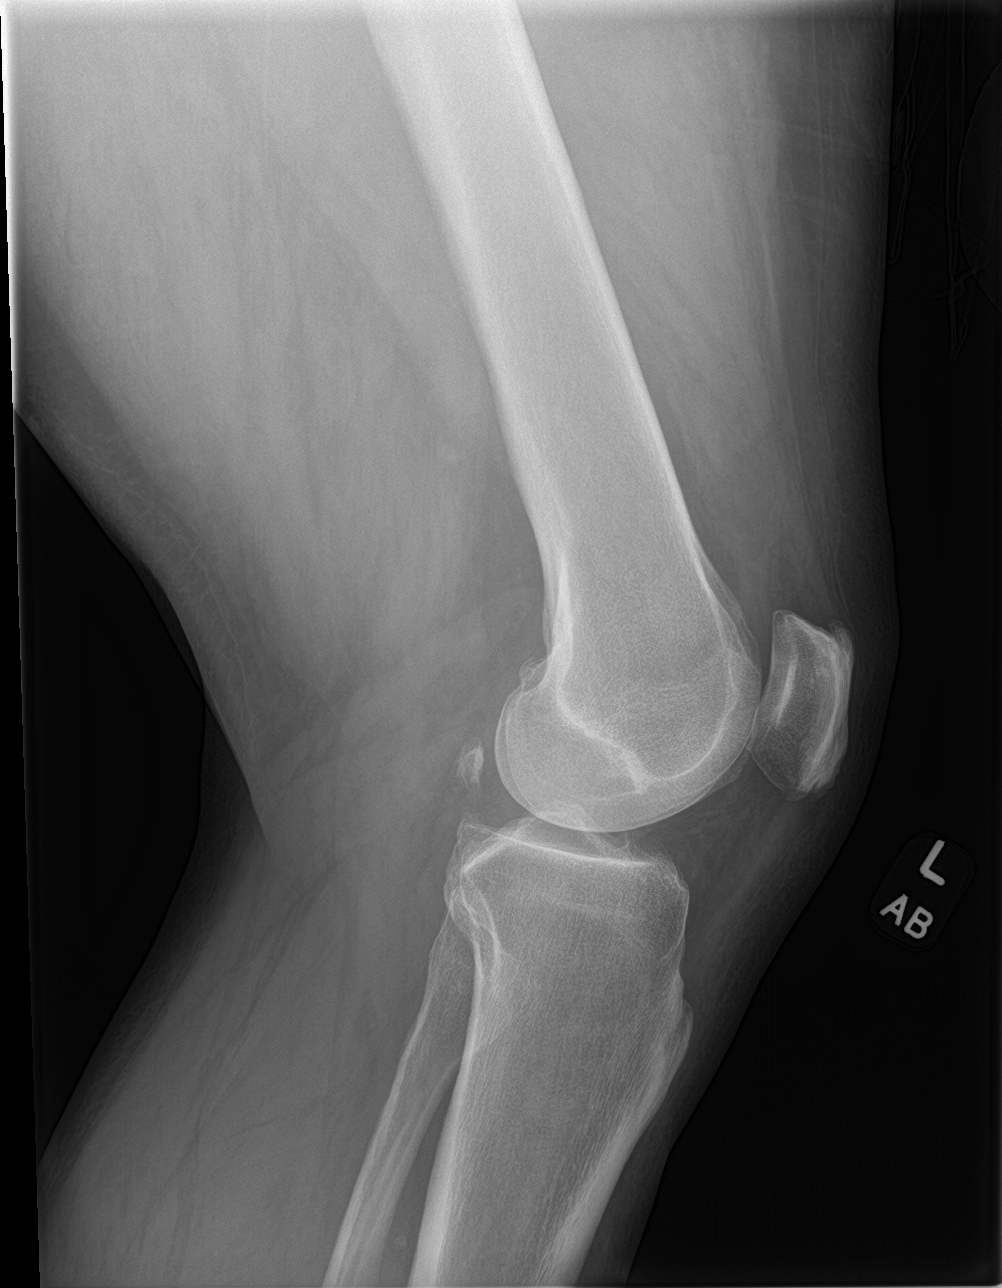

[knee obl (1 of 2)]
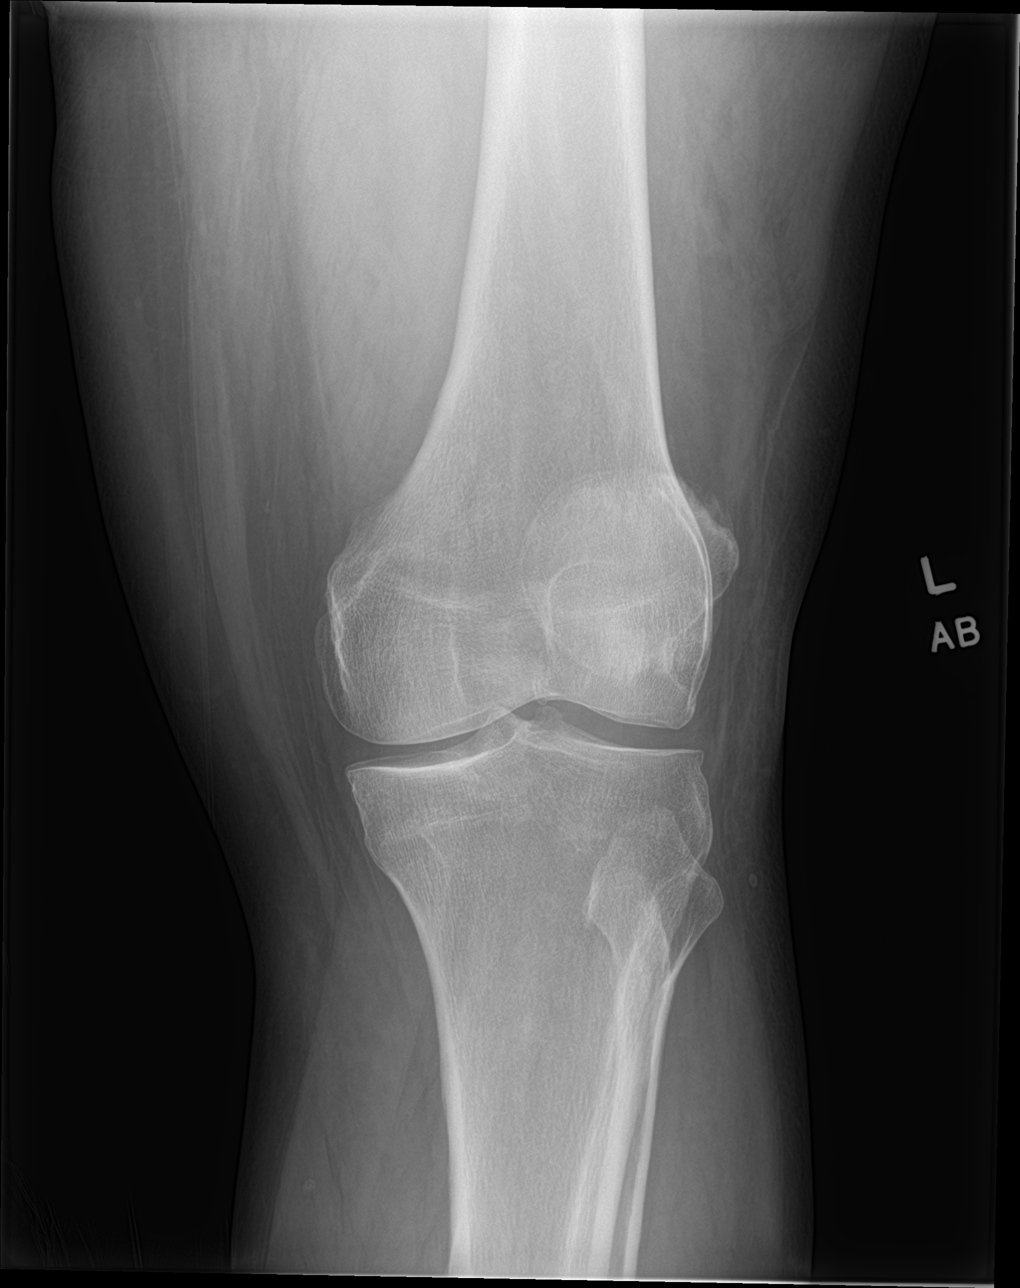

[knee obl (2 of 2)]
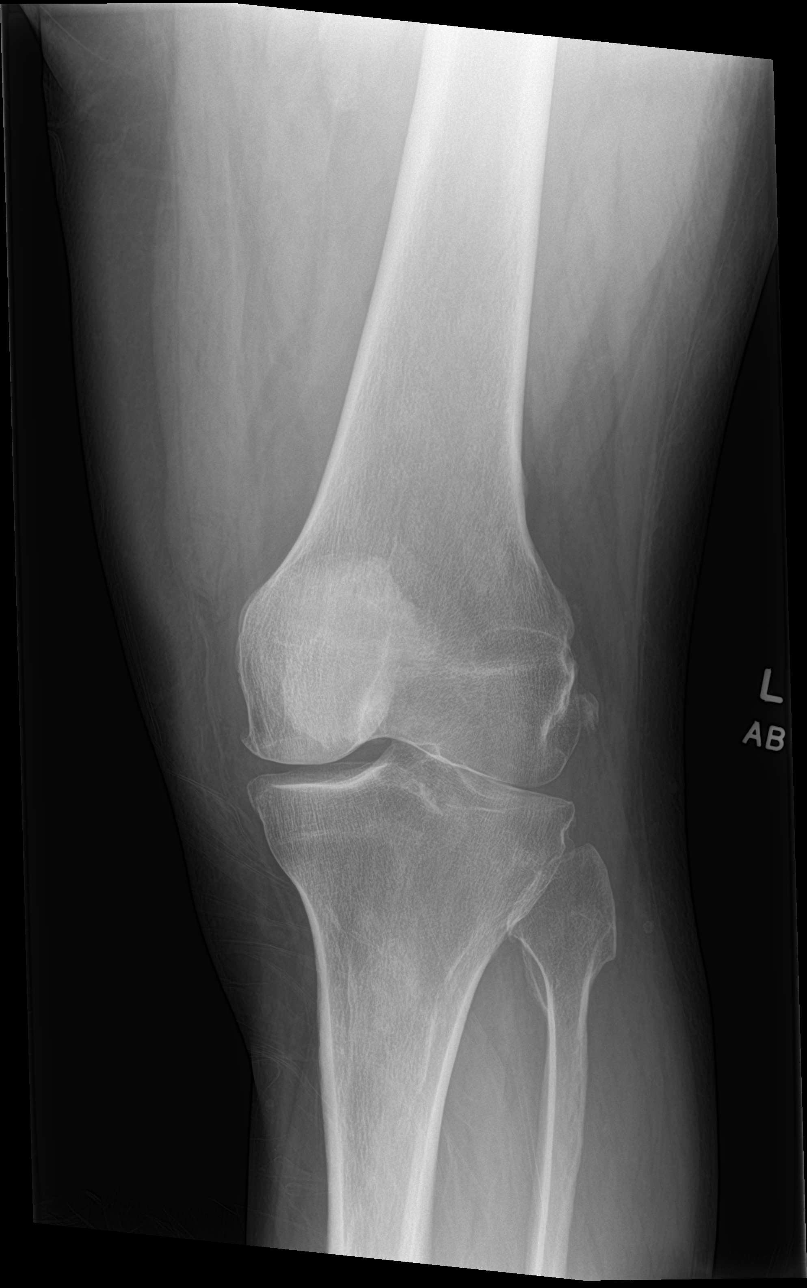

[4 of 4 positions shown; findings below may reference images not displayed]

FINDINGS: Mild/early tricompartmental degenerative changes for age but no
acute fracture, osteochondral lesion or obvious joint effusion.
IMPRESSION: Mild/early tricompartmental degenerative changes but no acute bony
findings or joint effusion.

## 2024-05-12 ENCOUNTER — Ambulatory Visit: Admitting: Family Medicine

## 2024-05-13 ENCOUNTER — Encounter: Payer: Self-pay | Admitting: Family Medicine

## 2024-06-03 DIAGNOSIS — G4733 Obstructive sleep apnea (adult) (pediatric): Secondary | ICD-10-CM | POA: Diagnosis not present

## 2024-08-03 ENCOUNTER — Other Ambulatory Visit: Payer: Self-pay | Admitting: Family Medicine

## 2024-08-03 DIAGNOSIS — R7303 Prediabetes: Secondary | ICD-10-CM
